# Patient Record
Sex: Male | Born: 1940 | Race: White | Hispanic: No | Marital: Married | Smoking: Former smoker
Health system: Southern US, Community
[De-identification: ages and names within clinical notes are randomized; demographics above are authoritative.]

## PROBLEM LIST (undated history)

## (undated) DIAGNOSIS — G2581 Restless legs syndrome: Secondary | ICD-10-CM

## (undated) DIAGNOSIS — K219 Gastro-esophageal reflux disease without esophagitis: Secondary | ICD-10-CM

## (undated) DIAGNOSIS — E785 Hyperlipidemia, unspecified: Secondary | ICD-10-CM

## (undated) DIAGNOSIS — R001 Bradycardia, unspecified: Secondary | ICD-10-CM

## (undated) HISTORY — PX: COLONOSCOPY: SHX174

## (undated) HISTORY — PX: APPENDECTOMY: SHX54

## (undated) HISTORY — DX: Hyperlipidemia, unspecified: E78.5

## (undated) HISTORY — DX: Bradycardia, unspecified: R00.1

## (undated) HISTORY — DX: Restless legs syndrome: G25.81

## (undated) HISTORY — DX: Gastro-esophageal reflux disease without esophagitis: K21.9

## (undated) HISTORY — PX: TONSILLECTOMY: SUR1361

## (undated) HISTORY — PX: CARDIAC CATHETERIZATION: SHX172

---

## 2000-10-18 ENCOUNTER — Ambulatory Visit (HOSPITAL_COMMUNITY): Admission: RE | Admit: 2000-10-18 | Discharge: 2000-10-18 | Payer: Self-pay | Admitting: Family Medicine

## 2000-10-18 ENCOUNTER — Encounter: Payer: Self-pay | Admitting: Family Medicine

## 2000-11-17 ENCOUNTER — Encounter: Payer: Self-pay | Admitting: Neurosurgery

## 2000-11-17 ENCOUNTER — Ambulatory Visit (HOSPITAL_COMMUNITY): Admission: RE | Admit: 2000-11-17 | Discharge: 2000-11-17 | Payer: Self-pay | Admitting: Neurosurgery

## 2001-06-05 ENCOUNTER — Ambulatory Visit (HOSPITAL_COMMUNITY): Admission: RE | Admit: 2001-06-05 | Discharge: 2001-06-05 | Payer: Self-pay | Admitting: Family Medicine

## 2001-06-05 ENCOUNTER — Encounter: Payer: Self-pay | Admitting: Family Medicine

## 2001-07-15 ENCOUNTER — Encounter: Payer: Self-pay | Admitting: Emergency Medicine

## 2001-07-15 ENCOUNTER — Observation Stay (HOSPITAL_COMMUNITY): Admission: EM | Admit: 2001-07-15 | Discharge: 2001-07-16 | Payer: Self-pay | Admitting: Emergency Medicine

## 2001-07-31 ENCOUNTER — Ambulatory Visit (HOSPITAL_COMMUNITY): Admission: RE | Admit: 2001-07-31 | Discharge: 2001-07-31 | Payer: Self-pay | Admitting: Family Medicine

## 2001-07-31 ENCOUNTER — Encounter: Payer: Self-pay | Admitting: Family Medicine

## 2003-03-06 ENCOUNTER — Ambulatory Visit (HOSPITAL_COMMUNITY): Admission: RE | Admit: 2003-03-06 | Discharge: 2003-03-06 | Payer: Self-pay | Admitting: Internal Medicine

## 2005-02-04 ENCOUNTER — Ambulatory Visit (HOSPITAL_COMMUNITY): Admission: RE | Admit: 2005-02-04 | Discharge: 2005-02-04 | Payer: Self-pay | Admitting: Family Medicine

## 2005-02-09 ENCOUNTER — Ambulatory Visit (HOSPITAL_COMMUNITY): Admission: RE | Admit: 2005-02-09 | Discharge: 2005-02-09 | Payer: Self-pay | Admitting: Family Medicine

## 2008-12-30 ENCOUNTER — Ambulatory Visit (HOSPITAL_COMMUNITY): Admission: RE | Admit: 2008-12-30 | Discharge: 2008-12-30 | Payer: Self-pay | Admitting: Internal Medicine

## 2009-01-03 ENCOUNTER — Ambulatory Visit (HOSPITAL_COMMUNITY): Admission: RE | Admit: 2009-01-03 | Discharge: 2009-01-03 | Payer: Self-pay | Admitting: Internal Medicine

## 2010-04-08 ENCOUNTER — Emergency Department (HOSPITAL_COMMUNITY): Payer: PRIVATE HEALTH INSURANCE

## 2010-04-08 ENCOUNTER — Emergency Department (HOSPITAL_COMMUNITY)
Admission: EM | Admit: 2010-04-08 | Discharge: 2010-04-08 | Disposition: A | Discharge status: Home / Self Care | Payer: PRIVATE HEALTH INSURANCE | Attending: Emergency Medicine | Admitting: Emergency Medicine

## 2010-04-08 DIAGNOSIS — K5289 Other specified noninfective gastroenteritis and colitis: Secondary | ICD-10-CM | POA: Insufficient documentation

## 2010-04-08 DIAGNOSIS — R112 Nausea with vomiting, unspecified: Secondary | ICD-10-CM | POA: Insufficient documentation

## 2010-04-08 DIAGNOSIS — K219 Gastro-esophageal reflux disease without esophagitis: Secondary | ICD-10-CM | POA: Insufficient documentation

## 2010-04-08 DIAGNOSIS — Z79899 Other long term (current) drug therapy: Secondary | ICD-10-CM | POA: Insufficient documentation

## 2010-04-08 DIAGNOSIS — E7800 Pure hypercholesterolemia, unspecified: Secondary | ICD-10-CM | POA: Insufficient documentation

## 2010-04-08 LAB — URINALYSIS, ROUTINE W REFLEX MICROSCOPIC
Bilirubin Urine: NEGATIVE
Glucose, UA: NEGATIVE mg/dL
Ketones, ur: NEGATIVE mg/dL
Protein, ur: NEGATIVE mg/dL
Specific Gravity, Urine: 1.015 (ref 1.005–1.030)

## 2010-04-08 LAB — DIFFERENTIAL
Eosinophils Absolute: 0.1 10*3/uL (ref 0.0–0.7)
Eosinophils Relative: 1 % (ref 0–5)
Lymphocytes Relative: 26 % (ref 12–46)
Lymphs Abs: 1.7 10*3/uL (ref 0.7–4.0)
Monocytes Absolute: 0.6 10*3/uL (ref 0.1–1.0)

## 2010-04-08 LAB — COMPREHENSIVE METABOLIC PANEL
ALT: 26 U/L (ref 0–53)
Alkaline Phosphatase: 61 U/L (ref 39–117)
CO2: 26 mEq/L (ref 19–32)
Chloride: 103 mEq/L (ref 96–112)
Creatinine, Ser: 1.22 mg/dL (ref 0.4–1.5)
GFR calc Af Amer: >60 mL/min (ref >60)
GFR calc non Af Amer: 59 mL/min — ABNORMAL LOW (ref >60)
Potassium: 3.6 mEq/L (ref 3.5–5.1)
Sodium: 139 mEq/L (ref 135–145)
Total Bilirubin: 2 mg/dL — ABNORMAL HIGH (ref 0.3–1.2)
Total Protein: 7.4 g/dL (ref 6.0–8.3)

## 2010-04-08 LAB — LIPASE, BLOOD: Lipase: 25 U/L (ref 11–59)

## 2010-04-08 LAB — CBC
Hemoglobin: 15.8 g/dL (ref 13.0–17.0)
Platelets: 180 10*3/uL (ref 150–400)
RBC: 5.3 MIL/uL (ref 4.22–5.81)
RDW: 13.9 % (ref 11.5–15.5)

## 2010-06-12 NOTE — Discharge Summary (Signed)
Andrew Mcgrath  Patient:    Andrew Mcgrath, Andrew Mcgrath Visit Number: [id number] MRN: [id number]          Service Type: MED Location: [facility] [phone] Attending Physician:  Darlin Priestly Dictated by:   Colette Ribas, M.D. Admit Date:  07/15/2001 Discharge Date: 07/16/2001                             Discharge Summary  DISCHARGE DIAGNOSES:  Chest pain, rule out myocardial infarction.  HISTORY OF PRESENT ILLNESS AND PAST MEDICAL HISTORY:  Please see admission H&P.  HOSPITAL COURSE:  A 70 year old gentleman with mild hyperlipidemia who has presented with right-sided chest pain.  He is admitted and ruled out.  CPK-MB and troponins were negative x3.  No ECG changes.  No telemetry changes while on monitoring.  He had no further episodes whatsoever of chest pain or any other associated symptomatology.  He felt quite well on the day of discharge. I talked with Dr. Domingo Sep about the patients case and she agreed with discharge and work-up as an outpatient.  The patient and his wife understand that if he has recurrent chest pain he is to take nitroglycerin and come back in immediately.  We will set up for outpatient follow-up with Dr. Domingo Sep tomorrow in her office.  We will set up further risk stratification at that time.  If any problems arise in the meantime he is to call or come back immediately.  DISCHARGE PHYSICAL EXAMINATION  VITAL SIGNS:  Afebrile.  Vital signs stable.  GENERAL:  Pleasant, talkative gentleman in no acute distress.  CHEST:  Clear to auscultation bilaterally.  CARDIOVASCULAR:  Rate and rhythm normal.  S1, S2.  No murmurs, gallops, rubs.  DISCHARGE LABORATORIES:  As stated above.  DISCHARGE CONDITION:  Stable.  DISCHARGE MEDICATIONS:  Same as admit, nitroglycerin p.r.n. Dictated by:   Colette Ribas, M.D. Attending Physician:  Darlin Priestly DD:  07/16/01 TD:  07/18/01 Job: [id number]

## 2010-06-12 NOTE — H&P (Signed)
[facility]  Patient:    Andrew Mcgrath, Andrew Mcgrath Visit Number: [id number] MRN: [id number]          Service Type: MED Location: [facility] [id number] Attending Physician:  Darlin Priestly Dictated by:   Dorthey Sawyer, M.D. Admit Date:  07/15/2001 Discharge Date: 07/16/2001                           History and Physical  ADMISSION DIAGNOSIS:  Chest pain, rule out myocardial infarction.  HISTORY OF PRESENT ILLNESS:  A 70 year old gentleman with hyperlipidemia who, the night before admission, awoke with right-sided chest pain which radiated to his right arm and right jaw.  It seemed to last 30 minutes to 45 minutes and then subsided.   He did not have any chest pain prior to this.   There was some associated diaphoresis no nausea.  It seemed to subside after about 30 to 45 minutes as stated.  When I saw him on Saturday morning in the office, he was doing fine with no complaints.  The ECG drawn in the office showed some slight ST changes which were different from a prior ECG, and that was in leads II, III, and some of the precordial leads.  There was also T wave inversion in V3 which was not in the prior ECG.  The patient was sent to the emergency department for further evaluation which was an overall negative workup.  The first set of enzymes were negative. Chest x-ray was negative per the emergency department.  The ER physician discussed this with Dr. Domingo Sep of [facility], and she felt the patient should be ruled out overnight, heparinized, and use nitroglycerin until further risk stratification could be performed.  PAST MEDICAL HISTORY:  Hypercholesterolemia.  MEDICATIONS ON ADMISSION:  Lipitor 10 mg 1/2 tablet q.h.s.  PHYSICAL EXAMINATION:  VITAL SIGNS:  Temperature 98.2, pulse 72, respirations 18, blood pressure 120/75.  GENERAL:  When I saw the patient, she was pleasant and in no acute distress.  HEENT:  Normocephalic, atraumatic.  Pupils  are equal, round and reactive to light.  Extraocular muscles intact.   Nasopharynx clear.  NECK:  No JVD.  CHEST:  Clear to auscultation bilaterally.  CARDIOVASCULAR:  Regular rate and rhythm, normal S1 and S2.  No murmurs, gallops, or rubs.  ABDOMEN:  Bowel sounds positive.  Soft, nontender, nondistended.  No obvious organomegaly, no masses.  EXTREMITIES:  No cyanosis, clubbing, or edema.  LABORATORY DATA:  D-dimer less than 0.50.  CBC normal.  PTT 24, PT 12.6. CHEM-12 normal.  Total bilirubin 1.7.  ASSESSMENT:  A 70 year old gentleman with hyperlipidemia who presents with right-sided chest pain.  PLAN: 1. Admit for rule out protocol. 2. Nitroglycerin drip. 3. Heparin drip. 4. Serial enzymes q.8h. x 3. 5. Will continue to closely.  If the patient rules out, will perform risk    stratification. Dictated by:   Dorthey Sawyer, M.D. Attending Physician:  Darlin Priestly DD:  07/16/01 TD:  07/17/01 Job: [id number]

## 2010-06-12 NOTE — Op Note (Signed)
NAME:  Andrew Mcgrath, Andrew Mcgrath                       ACCOUNT NO.:  [id number]   MEDICAL RECORD NO.:  [id number]                   PATIENT TYPE:  AMB   LOCATION:  DAY                                  FACILITY:  [facility]   PHYSICIAN:  Lionel December, M.D.                 DATE OF BIRTH:  1940-10-03   DATE OF PROCEDURE:  03/06/2003  DATE OF DISCHARGE:                                 OPERATIVE REPORT   PROCEDURE:  Total colonoscopy.   ENDOSCOPIST:  Lionel December, M.D.   INDICATIONS:  Jigar is a 70 year old Caucasian male who is undergoing  screening colonoscopy.  The procedure and risks were reviewed with the  patient and informed consent was obtained.   PREOPERATIVE MEDICATIONS:  Demerol 25 mg IV and Versed 4 mg IV.   FINDINGS:  Procedure performed in endoscopy suite.  The patient's vital  signs and O2 saturation were monitored during the procedure and remained  stable.  The patient was placed in the left lateral recumbent position and  rectal examination was performed.  No abnormality noted on external or  digital exam.   Olympus videoscope was placed in the rectum and advanced under vision into  the sigmoid colon and beyond.  The scope was passed into the cecum which was  identified by ileocecal valve and appendiceal stump.  Pictures were taken  for the record.  As the scope was withdrawn the colonic mucosa was carefully  examined.  There was a 4-5 mm polyp at hepatic flexure which was snared.  A  piece of this polyp was retrieved for histologic examination.  Most of it  was coagulated during this process.  The mucosa and the rest of the colon  was normal.  The rectal mucosa similarly was normal.   The scope was retroflexed to examine the anorectal junction and hemorrhoids  were noted below the dentate line.  The endoscope was straightened and  withdrawn.  The patient tolerated the procedure well.   FINAL DIAGNOSES:  1. Small polyp at hepatic flexure which was snared.  2. Small external  hemorrhoids.   RECOMMENDATIONS:  Standard instructions were given. I will be contacting the  patient with biopsy results and further recommendations.      ___________________________________________                                            Lionel December, M.D.   NR/MEDQ  D:  03/06/2003  T:  03/06/2003  Job:  [id number]   cc:   Patrica Duel, M.D.  [address]  [place]  [place] [address]  Fax: [phone]

## 2010-07-08 ENCOUNTER — Other Ambulatory Visit (HOSPITAL_COMMUNITY): Payer: Self-pay | Admitting: Internal Medicine

## 2010-07-08 DIAGNOSIS — R51 Headache: Secondary | ICD-10-CM

## 2010-07-10 ENCOUNTER — Ambulatory Visit (HOSPITAL_COMMUNITY)
Admission: RE | Admit: 2010-07-10 | Discharge: 2010-07-10 | Disposition: A | Discharge status: Home / Self Care | Payer: PRIVATE HEALTH INSURANCE | Source: Ambulatory Visit | Attending: Internal Medicine | Admitting: Internal Medicine

## 2010-07-10 DIAGNOSIS — R55 Syncope and collapse: Secondary | ICD-10-CM | POA: Insufficient documentation

## 2010-07-10 DIAGNOSIS — H538 Other visual disturbances: Secondary | ICD-10-CM | POA: Insufficient documentation

## 2010-07-10 DIAGNOSIS — R51 Headache: Secondary | ICD-10-CM

## 2010-07-13 ENCOUNTER — Other Ambulatory Visit (HOSPITAL_COMMUNITY): Payer: Self-pay | Admitting: Internal Medicine

## 2010-07-14 ENCOUNTER — Ambulatory Visit (HOSPITAL_COMMUNITY)
Admission: RE | Admit: 2010-07-14 | Discharge: 2010-07-14 | Disposition: A | Discharge status: Home / Self Care | Payer: PRIVATE HEALTH INSURANCE | Source: Ambulatory Visit | Attending: Internal Medicine | Admitting: Internal Medicine

## 2010-07-14 ENCOUNTER — Other Ambulatory Visit (HOSPITAL_COMMUNITY): Payer: Self-pay | Admitting: Internal Medicine

## 2010-07-14 DIAGNOSIS — R9389 Abnormal findings on diagnostic imaging of other specified body structures: Secondary | ICD-10-CM | POA: Insufficient documentation

## 2010-07-14 DIAGNOSIS — I6529 Occlusion and stenosis of unspecified carotid artery: Secondary | ICD-10-CM | POA: Insufficient documentation

## 2010-07-14 DIAGNOSIS — R51 Headache: Secondary | ICD-10-CM | POA: Insufficient documentation

## 2010-07-14 DIAGNOSIS — H538 Other visual disturbances: Secondary | ICD-10-CM | POA: Insufficient documentation

## 2010-07-15 ENCOUNTER — Ambulatory Visit (HOSPITAL_COMMUNITY)
Admission: RE | Admit: 2010-07-15 | Discharge: 2010-07-15 | Disposition: A | Discharge status: Home / Self Care | Payer: PRIVATE HEALTH INSURANCE | Source: Ambulatory Visit | Attending: Internal Medicine | Admitting: Internal Medicine

## 2010-07-15 ENCOUNTER — Other Ambulatory Visit (HOSPITAL_COMMUNITY): Payer: Self-pay | Admitting: Internal Medicine

## 2010-07-15 DIAGNOSIS — R51 Headache: Secondary | ICD-10-CM | POA: Insufficient documentation

## 2010-07-15 MED ORDER — GADOBENATE DIMEGLUMINE 529 MG/ML IV SOLN: 20.0000 mL | Freq: Once | INTRAVENOUS | Status: AC | PRN

## 2010-07-16 LAB — CREATININE, SERUM
Creatinine, Ser: 1.39 mg/dL — ABNORMAL HIGH (ref 0.50–1.35)
GFR calc non Af Amer: 51 mL/min — ABNORMAL LOW (ref >60)

## 2010-07-20 ENCOUNTER — Other Ambulatory Visit (HOSPITAL_COMMUNITY): Payer: Self-pay | Admitting: Internal Medicine

## 2010-07-20 DIAGNOSIS — I771 Stricture of artery: Secondary | ICD-10-CM

## 2010-07-23 ENCOUNTER — Other Ambulatory Visit (HOSPITAL_COMMUNITY): Payer: Self-pay | Admitting: Internal Medicine

## 2010-07-23 ENCOUNTER — Ambulatory Visit (HOSPITAL_COMMUNITY)
Admission: RE | Admit: 2010-07-23 | Discharge: 2010-07-23 | Disposition: A | Discharge status: Home / Self Care | Payer: PRIVATE HEALTH INSURANCE | Source: Ambulatory Visit | Attending: Internal Medicine | Admitting: Internal Medicine

## 2010-07-23 DIAGNOSIS — Z01812 Encounter for preprocedural laboratory examination: Secondary | ICD-10-CM | POA: Insufficient documentation

## 2010-07-23 DIAGNOSIS — R42 Dizziness and giddiness: Secondary | ICD-10-CM | POA: Insufficient documentation

## 2010-07-23 DIAGNOSIS — I771 Stricture of artery: Secondary | ICD-10-CM

## 2010-07-23 DIAGNOSIS — I6529 Occlusion and stenosis of unspecified carotid artery: Secondary | ICD-10-CM

## 2010-07-23 DIAGNOSIS — R930 Abnormal findings on diagnostic imaging of skull and head, not elsewhere classified: Secondary | ICD-10-CM | POA: Insufficient documentation

## 2010-07-23 LAB — CBC
HCT: 43.9 % (ref 39.0–52.0)
Hemoglobin: 15.3 g/dL (ref 13.0–17.0)
Platelets: 159 10*3/uL (ref 150–400)
RBC: 5.14 MIL/uL (ref 4.22–5.81)

## 2010-07-23 LAB — PROTIME-INR
INR: 0.96 (ref 0.00–1.49)
Prothrombin Time: 13 seconds (ref 11.6–15.2)

## 2010-07-23 LAB — BASIC METABOLIC PANEL
CO2: 29 mEq/L (ref 19–32)
Creatinine, Ser: 1.07 mg/dL (ref 0.50–1.35)
GFR calc Af Amer: >60 mL/min (ref >60)
GFR calc non Af Amer: >60 mL/min (ref >60)
Potassium: 4 mEq/L (ref 3.5–5.1)
Sodium: 142 mEq/L (ref 135–145)

## 2010-07-23 LAB — APTT: aPTT: 29 seconds (ref 24–37)

## 2010-07-23 MED ORDER — IOHEXOL 300 MG/ML  SOLN
150.0000 mL | Freq: Once | INTRAMUSCULAR | Status: AC | PRN
  Administered 2010-07-23: 70 mL via INTRAVENOUS

## 2011-12-31 ENCOUNTER — Ambulatory Visit (HOSPITAL_COMMUNITY)
Admission: RE | Admit: 2011-12-31 | Discharge: 2011-12-31 | Disposition: A | Discharge status: Home / Self Care | Payer: PRIVATE HEALTH INSURANCE | Source: Ambulatory Visit | Attending: Internal Medicine | Admitting: Internal Medicine

## 2011-12-31 ENCOUNTER — Other Ambulatory Visit (HOSPITAL_COMMUNITY): Payer: Self-pay | Admitting: Internal Medicine

## 2011-12-31 DIAGNOSIS — R05 Cough: Secondary | ICD-10-CM

## 2011-12-31 DIAGNOSIS — R509 Fever, unspecified: Secondary | ICD-10-CM

## 2011-12-31 DIAGNOSIS — R059 Cough, unspecified: Secondary | ICD-10-CM | POA: Insufficient documentation

## 2012-12-14 ENCOUNTER — Ambulatory Visit (INDEPENDENT_AMBULATORY_CARE_PROVIDER_SITE_OTHER): Payer: PRIVATE HEALTH INSURANCE

## 2012-12-14 ENCOUNTER — Encounter: Payer: Self-pay | Admitting: Podiatry

## 2012-12-14 ENCOUNTER — Ambulatory Visit (INDEPENDENT_AMBULATORY_CARE_PROVIDER_SITE_OTHER): Payer: PRIVATE HEALTH INSURANCE | Admitting: Podiatry

## 2012-12-14 VITALS — BP 149/87 | HR 61 | Resp 16 | Ht 73.0 in | Wt 225.0 lb

## 2012-12-14 DIAGNOSIS — M722 Plantar fascial fibromatosis: Secondary | ICD-10-CM

## 2012-12-14 MED ORDER — DICLOFENAC SODIUM 75 MG PO TBEC: 75.0000 mg | DELAYED_RELEASE_TABLET | Freq: Two times a day (BID) | ORAL | Status: DC

## 2012-12-14 MED ORDER — TRIAMCINOLONE ACETONIDE 10 MG/ML IJ SUSP
5.0000 mg | Freq: Once | INTRAMUSCULAR | Status: AC
  Administered 2012-12-14: 5 mg via INTRA_ARTICULAR

## 2012-12-14 NOTE — Patient Instructions (Signed)
Plantar Fasciitis (Heel Spur Syndrome)  with Rehab  The plantar fascia is a fibrous, ligament-like, soft-tissue structure that spans the bottom of the foot. Plantar fasciitis is a condition that causes pain in the foot due to inflammation of the tissue.  SYMPTOMS   · Pain and tenderness on the underneath side of the foot.  · Pain that worsens with standing or walking.  CAUSES   Plantar fasciitis is caused by irritation and injury to the plantar fascia on the underneath side of the foot. Common mechanisms of injury include:  · Direct trauma to bottom of the foot.  · Damage to a small nerve that runs under the foot where the main fascia attaches to the heel bone.  · Stress placed on the plantar fascia due to bone spurs.  RISK INCREASES WITH:   · Activities that place stress on the plantar fascia (running, jumping, pivoting, or cutting).  · Poor strength and flexibility.  · Improperly fitted shoes.  · Tight calf muscles.  · Flat feet.  · Failure to warm-up properly before activity.  · Obesity.  PREVENTION  · Warm up and stretch properly before activity.  · Allow for adequate recovery between workouts.  · Maintain physical fitness:  · Strength, flexibility, and endurance.  · Cardiovascular fitness.  · Maintain a health body weight.  · Avoid stress on the plantar fascia.  · Wear properly fitted shoes, including arch supports for individuals who have flat feet.  PROGNOSIS   If treated properly, then the symptoms of plantar fasciitis usually resolve without surgery. However, occasionally surgery is necessary.  RELATED COMPLICATIONS   · Recurrent symptoms that may result in a chronic condition.  · Problems of the lower back that are caused by compensating for the injury, such as limping.  · Pain or weakness of the foot during push-off following surgery.  · Chronic inflammation, scarring, and partial or complete fascia tear, occurring more often from repeated injections.  TREATMENT   Treatment initially involves the use of  ice and medication to help reduce pain and inflammation. The use of strengthening and stretching exercises may help reduce pain with activity, especially stretches of the Achilles tendon. These exercises may be performed at home or with a therapist. Your caregiver may recommend that you use heel cups of arch supports to help reduce stress on the plantar fascia. Occasionally, corticosteroid injections are given to reduce inflammation. If symptoms persist for greater than 6 months despite non-surgical (conservative), then surgery may be recommended.   MEDICATION   · If pain medication is necessary, then nonsteroidal anti-inflammatory medications, such as aspirin and ibuprofen, or other minor pain relievers, such as acetaminophen, are often recommended.  · Do not take pain medication within 7 days before surgery.  · Prescription pain relievers may be given if deemed necessary by your caregiver. Use only as directed and only as much as you need.  · Corticosteroid injections may be given by your caregiver. These injections should be reserved for the most serious cases, because they may only be given a certain number of times.  HEAT AND COLD  · Cold treatment (icing) relieves pain and reduces inflammation. Cold treatment should be applied for 10 to 15 minutes every 2 to 3 hours for inflammation and pain and immediately after any activity that aggravates your symptoms. Use ice packs or massage the area with a piece of ice (ice massage).  · Heat treatment may be used prior to performing the stretching and strengthening activities prescribed   by your caregiver, physical therapist, or athletic trainer. Use a heat pack or soak the injury in warm water.  SEEK IMMEDIATE MEDICAL CARE IF:  · Treatment seems to offer no benefit, or the condition worsens.  · Any medications produce adverse side effects.  EXERCISES  RANGE OF MOTION (ROM) AND STRETCHING EXERCISES - Plantar Fasciitis (Heel Spur Syndrome)  These exercises may help you  when beginning to rehabilitate your injury. Your symptoms may resolve with or without further involvement from your physician, physical therapist or athletic trainer. While completing these exercises, remember:   · Restoring tissue flexibility helps normal motion to return to the joints. This allows healthier, less painful movement and activity.  · An effective stretch should be held for at least 30 seconds.  · A stretch should never be painful. You should only feel a gentle lengthening or release in the stretched tissue.  RANGE OF MOTION - Toe Extension, Flexion  · Sit with your right / left leg crossed over your opposite knee.  · Grasp your toes and gently pull them back toward the top of your foot. You should feel a stretch on the bottom of your toes and/or foot.  · Hold this stretch for __________ seconds.  · Now, gently pull your toes toward the bottom of your foot. You should feel a stretch on the top of your toes and or foot.  · Hold this stretch for __________ seconds.  Repeat __________ times. Complete this stretch __________ times per day.   RANGE OF MOTION - Ankle Dorsiflexion, Active Assisted  · Remove shoes and sit on a chair that is preferably not on a carpeted surface.  · Place right / left foot under knee. Extend your opposite leg for support.  · Keeping your heel down, slide your right / left foot back toward the chair until you feel a stretch at your ankle or calf. If you do not feel a stretch, slide your bottom forward to the edge of the chair, while still keeping your heel down.  · Hold this stretch for __________ seconds.  Repeat __________ times. Complete this stretch __________ times per day.   STRETCH  Gastroc, Standing  · Place hands on wall.  · Extend right / left leg, keeping the front knee somewhat bent.  · Slightly point your toes inward on your back foot.  · Keeping your right / left heel on the floor and your knee straight, shift your weight toward the wall, not allowing your back to  arch.  · You should feel a gentle stretch in the right / left calf. Hold this position for __________ seconds.  Repeat __________ times. Complete this stretch __________ times per day.  STRETCH  Soleus, Standing  · Place hands on wall.  · Extend right / left leg, keeping the other knee somewhat bent.  · Slightly point your toes inward on your back foot.  · Keep your right / left heel on the floor, bend your back knee, and slightly shift your weight over the back leg so that you feel a gentle stretch deep in your back calf.  · Hold this position for __________ seconds.  Repeat __________ times. Complete this stretch __________ times per day.  STRETCH  Gastrocsoleus, Standing   Note: This exercise can place a lot of stress on your foot and ankle. Please complete this exercise only if specifically instructed by your caregiver.   · Place the ball of your right / left foot on a step, keeping   your other foot firmly on the same step.  · Hold on to the wall or a rail for balance.  · Slowly lift your other foot, allowing your body weight to press your heel down over the edge of the step.  · You should feel a stretch in your right / left calf.  · Hold this position for __________ seconds.  · Repeat this exercise with a slight bend in your right / left knee.  Repeat __________ times. Complete this stretch __________ times per day.   STRENGTHENING EXERCISES - Plantar Fasciitis (Heel Spur Syndrome)   These exercises may help you when beginning to rehabilitate your injury. They may resolve your symptoms with or without further involvement from your physician, physical therapist or athletic trainer. While completing these exercises, remember:   · Muscles can gain both the endurance and the strength needed for everyday activities through controlled exercises.  · Complete these exercises as instructed by your physician, physical therapist or athletic trainer. Progress the resistance and repetitions only as guided.  STRENGTH - Towel  Curls  · Sit in a chair positioned on a non-carpeted surface.  · Place your foot on a towel, keeping your heel on the floor.  · Pull the towel toward your heel by only curling your toes. Keep your heel on the floor.  · If instructed by your physician, physical therapist or athletic trainer, add ____________________ at the end of the towel.  Repeat __________ times. Complete this exercise __________ times per day.  STRENGTH - Ankle Inversion  · Secure one end of a rubber exercise band/tubing to a fixed object (table, pole). Loop the other end around your foot just before your toes.  · Place your fists between your knees. This will focus your strengthening at your ankle.  · Slowly, pull your big toe up and in, making sure the band/tubing is positioned to resist the entire motion.  · Hold this position for __________ seconds.  · Have your muscles resist the band/tubing as it slowly pulls your foot back to the starting position.  Repeat __________ times. Complete this exercises __________ times per day.   Document Released: 01/11/2005 Document Revised: 04/05/2011 Document Reviewed: 04/25/2008  [employer]® Patient Information ©2014 [employer].

## 2012-12-14 NOTE — Progress Notes (Signed)
  Subjective:    Patient ID: Andrew Mcgrath, male    DOB: 12-10-1940, 72 y.o.   MRN: [id number]  HPI Comments: "I've got pain in the middle of my heel"  N - aches L - plantar heel right D - 1 yr. O - gradual C - AM pain, worse A - walking T - Saw podiatrist in [place], gave series of 3 shots, Rx'd Skelaxin, no help, did see PCP and changed meds to Diclofenac, wearing OTC inserts   Pt states a lady at his church is a patient of Dr. Charlsie Merles and recommended our facility.      Review of Systems  Eyes: Positive for redness and itching.  Musculoskeletal: Positive for gait problem and myalgias.  All other systems reviewed and are negative.       Objective:   Physical Exam        Assessment & Plan:

## 2012-12-15 NOTE — Progress Notes (Signed)
Subjective:     Patient ID: Andrew Mcgrath, male   DOB: 02-09-1940, 72 y.o.   MRN: [id number]  HPI patient presents stating I have had heel pain right for 1 year and is seeing another physician who did 3 injections which gave temporary relief but no long-term relief. I've tried over-the-counter insoles without relief of symptoms and any time I try to do activity I have intense pain afterward  Review of Systems  All other systems reviewed and are negative.       Objective:   Physical Exam  Nursing note and vitals reviewed. Constitutional: He is oriented to person, place, and time.  Cardiovascular: Intact distal pulses.   Musculoskeletal: Normal range of motion.  Neurological: He is oriented to person, place, and time.  Skin: Skin is warm.   neurovascular status intact with mild equinus condition noted both feet patient has discomfort at the insertion of the tendon into the calcaneus with inflammation and fluid around the attachment.     Assessment:     Plantar fasciitis right that has not responded conservatively at this time    Plan:     I reviewed a great length plantar fasciitis and x-rays today. I have recommended a relatively aggressive approach and if no improvement we will need to consider surgery or E. Andrew treatment. At this time I injected the plantar fascia 3 mg Kenalog 5 mg Xylocaine Marcaine mixture and dispensed night splint with all instructions on usage along with aggressive ice therapy. Reappoint in 3 weeks

## 2013-01-04 ENCOUNTER — Ambulatory Visit (INDEPENDENT_AMBULATORY_CARE_PROVIDER_SITE_OTHER): Payer: PRIVATE HEALTH INSURANCE | Admitting: Podiatry

## 2013-01-04 ENCOUNTER — Encounter: Payer: Self-pay | Admitting: Podiatry

## 2013-01-04 VITALS — BP 145/72 | HR 62 | Resp 17 | Ht 73.0 in | Wt 220.0 lb

## 2013-01-04 DIAGNOSIS — M722 Plantar fascial fibromatosis: Secondary | ICD-10-CM

## 2013-01-04 NOTE — Progress Notes (Signed)
Follow-up for right heel pain.  Pt states has improved.

## 2013-01-08 NOTE — Progress Notes (Signed)
Subjective:     Patient ID: Andrew Mcgrath, male   DOB: 04/09/1940, 72 y.o.   MRN: [id number]  HPI patient states I'm doing much better with pain of a mild nature   Review of Systems     Objective:   Physical Exam Neurovascular status intact with no health and minimal pain to palpation of the heel history issues noted    Assessment:     Improve plantar fasciitis heels    Plan:     Instructed on physical therapy supportive shoe gear and anti-inflammatories. Reappoint as needed

## 2013-03-22 ENCOUNTER — Ambulatory Visit: Payer: PRIVATE HEALTH INSURANCE | Admitting: Podiatry

## 2013-04-02 ENCOUNTER — Ambulatory Visit: Payer: PRIVATE HEALTH INSURANCE | Admitting: Podiatry

## 2013-04-11 ENCOUNTER — Ambulatory Visit: Payer: PRIVATE HEALTH INSURANCE | Admitting: Podiatry

## 2013-06-20 ENCOUNTER — Telehealth (INDEPENDENT_AMBULATORY_CARE_PROVIDER_SITE_OTHER): Payer: Self-pay | Admitting: *Deleted

## 2013-06-20 ENCOUNTER — Other Ambulatory Visit (INDEPENDENT_AMBULATORY_CARE_PROVIDER_SITE_OTHER): Payer: Self-pay | Admitting: *Deleted

## 2013-06-20 DIAGNOSIS — Z1211 Encounter for screening for malignant neoplasm of colon: Secondary | ICD-10-CM

## 2013-06-20 DIAGNOSIS — Z860100 Personal history of colon polyps, unspecified: Secondary | ICD-10-CM

## 2013-06-20 MED ORDER — PEG-KCL-NACL-NASULF-NA ASC-C 100 G PO SOLR: 1.0000 | Freq: Once | ORAL | Status: DC

## 2013-06-20 NOTE — Telephone Encounter (Signed)
Patient needs movi prep 

## 2013-08-02 ENCOUNTER — Telehealth (INDEPENDENT_AMBULATORY_CARE_PROVIDER_SITE_OTHER): Payer: Self-pay | Admitting: *Deleted

## 2013-08-02 NOTE — Telephone Encounter (Signed)
  Procedure: tcs  Reason/Indication:  Hx polyps  Has patient had this procedure before?  Yes, 2005 -- epic  If so, when, by whom and where?    Is there a family history of colon cancer?  no  Who?  What age when diagnosed?    Is patient diabetic?   no      Does patient have prosthetic heart valve?  no  Do you have a pacemaker?  no  Has patient ever had endocarditis? no  Has patient had joint replacement within last 12 months?  no  Does patient tend to be constipated or take laxatives? yes  Is patient on Coumadin, Plavix and/or Aspirin? no  Medications: atorvastatin 40 mg daily, lyrica 75 mg nightly, omeprazole 20 mg daily, citrucel daily  Allergies: nkda  Medication Adjustment:   Procedure date & time: 08/23/13 at 12

## 2013-08-06 NOTE — Telephone Encounter (Signed)
agree

## 2013-08-14 ENCOUNTER — Encounter (HOSPITAL_COMMUNITY): Payer: Self-pay | Admitting: Pharmacy Technician

## 2013-08-23 ENCOUNTER — Ambulatory Visit (HOSPITAL_COMMUNITY)
Admission: RE | Admit: 2013-08-23 | Discharge: 2013-08-23 | Disposition: A | Discharge status: Home / Self Care | Payer: PRIVATE HEALTH INSURANCE | Source: Ambulatory Visit | Attending: Internal Medicine | Admitting: Internal Medicine

## 2013-08-23 ENCOUNTER — Encounter (HOSPITAL_COMMUNITY)
Admission: RE | Disposition: A | Discharge status: Home / Self Care | Payer: Self-pay | Source: Ambulatory Visit | Attending: Internal Medicine

## 2013-08-23 ENCOUNTER — Encounter (HOSPITAL_COMMUNITY): Payer: Self-pay | Admitting: *Deleted

## 2013-08-23 DIAGNOSIS — Z7982 Long term (current) use of aspirin: Secondary | ICD-10-CM | POA: Insufficient documentation

## 2013-08-23 DIAGNOSIS — Z860100 Personal history of colon polyps, unspecified: Secondary | ICD-10-CM

## 2013-08-23 DIAGNOSIS — Z79899 Other long term (current) drug therapy: Secondary | ICD-10-CM | POA: Insufficient documentation

## 2013-08-23 DIAGNOSIS — D126 Benign neoplasm of colon, unspecified: Secondary | ICD-10-CM | POA: Insufficient documentation

## 2013-08-23 DIAGNOSIS — K644 Residual hemorrhoidal skin tags: Secondary | ICD-10-CM

## 2013-08-23 DIAGNOSIS — Z87891 Personal history of nicotine dependence: Secondary | ICD-10-CM | POA: Insufficient documentation

## 2013-08-23 DIAGNOSIS — K219 Gastro-esophageal reflux disease without esophagitis: Secondary | ICD-10-CM | POA: Insufficient documentation

## 2013-08-23 DIAGNOSIS — E785 Hyperlipidemia, unspecified: Secondary | ICD-10-CM | POA: Insufficient documentation

## 2013-08-23 HISTORY — PX: COLONOSCOPY: SHX5424

## 2013-08-23 SURGERY — COLONOSCOPY
Anesthesia: Moderate Sedation

## 2013-08-23 MED ORDER — STERILE WATER FOR IRRIGATION IR SOLN
Status: DC | PRN
  Administered 2013-08-23: 11:00:00

## 2013-08-23 MED ORDER — SODIUM CHLORIDE 0.9 % IV SOLN
INTRAVENOUS | Status: DC
  Administered 2013-08-23: 11:00:00 via INTRAVENOUS

## 2013-08-23 MED ORDER — MIDAZOLAM HCL 5 MG/5ML IJ SOLN
INTRAMUSCULAR | Status: AC
  Filled 2013-08-23: qty 10

## 2013-08-23 MED ORDER — MEPERIDINE HCL 50 MG/ML IJ SOLN
INTRAMUSCULAR | Status: DC | PRN
  Administered 2013-08-23 (×2): 25 mg via INTRAVENOUS

## 2013-08-23 MED ORDER — MEPERIDINE HCL 50 MG/ML IJ SOLN
INTRAMUSCULAR | Status: AC
  Filled 2013-08-23: qty 1

## 2013-08-23 MED ORDER — MIDAZOLAM HCL 5 MG/5ML IJ SOLN
INTRAMUSCULAR | Status: DC | PRN
  Administered 2013-08-23: 2 mg via INTRAVENOUS
  Administered 2013-08-23 (×3): 1 mg via INTRAVENOUS
  Administered 2013-08-23: 2 mg via INTRAVENOUS

## 2013-08-23 NOTE — Discharge Instructions (Signed)
No aspirin or NSAIDs for 1 week ?Resume other medications and diet as before ?No driving for 24 hours ?Physician will call with biopsy results. ?

## 2013-08-23 NOTE — Op Note (Signed)
COLONOSCOPY PROCEDURE REPORT  PATIENT:  Andrew Mcgrath  MR#:  [id number] Birthdate:  [birth year], 73 y.o., male Endoscopist:  Dr. Rogene Houston, MD Referred By:  Dr. Bertell Maria call, MD  Procedure Date: 08/23/2013  Procedure:   Colonoscopy  Indications: Patient is a 73 year old Caucasian male with history of colonic adenoma and is here for surveillance colonoscopy.  Informed Consent:  The procedure and risks were reviewed with the patient and informed consent was obtained.  Medications:  Demerol 50 mg IV Versed 7 mg IV  Description of procedure:  After a digital rectal exam was performed, that colonoscope was advanced from the anus through the rectum and colon to the area of the cecum, ileocecal valve and appendiceal orifice. The cecum was deeply intubated. These structures were well-seen and photographed for the record. From the level of the cecum and ileocecal valve, the scope was slowly and cautiously withdrawn. The mucosal surfaces were carefully surveyed utilizing scope tip to flexion to facilitate fold flattening as needed. The scope was pulled down into the rectum where a thorough exam including retroflexion was performed.  Findings:  Prep satisfactory. 8 mm polyp snared in two pieces from ascending colon. This polyp was located just above the ileocecal valve behind a fold. Polypectomy complete. 3 mm polyp of there are "biopsy from proximal sigmoid colon. Normal rectal mucosa. Small hemorrhoids below the dentate line.   Therapeutic/Diagnostic Maneuvers Performed:  See above  Complications:  None  Cecal Withdrawal Time:  20 minutes  Impression:  Examination performed to cecum. 63mm polyp hot snared from ascending colon. 3 mm polyp of there are core biopsy from proximal sigmoid colon. Small external hemorrhoids.  Recommendations:  Standard instructions given. I will contact patient with biopsy results and further recommendations.  Surina Storts U  08/23/2013 12:09 PM  CC:  Dr. Delphina Cahill, MD & Dr. Rayne Du ref. provider found

## 2013-08-23 NOTE — H&P (Signed)
Andrew Mcgrath is an 73 y.o. male.   Chief Complaint: Patient is here for colonoscopy. HPI: Patient is 73 year old Caucasian male with history of colonic adenoma. He is in for surveillance colonoscopy. He denies abdominal pain change in bowel habits or rectal bleeding. He had adenoma removed from his ascending colon 10 years ago. No polyp was found on his last exam 5 years ago at [facility] in [place]. Family history is negative for CRC.   Past Medical History  Diagnosis Date  . Hyperlipidemia   . GERD (gastroesophageal reflux disease)   . Familial restless leg syndrome     Past Surgical History  Procedure Laterality Date  . Appendectomy    . Tonsillectomy    . Colonoscopy    . Cardiac catheterization      Family History  Problem Relation Age of Onset  . Colon cancer Neg Hx    Social History:  reports that he has quit smoking. His smoking use included Cigarettes. He has a 30 pack-year smoking history. He does not have any smokeless tobacco history on file. He reports that he drinks alcohol. He reports that he does not use illicit drugs.  Allergies: No Known Allergies  Medications Prior to Admission  Medication Sig Dispense Refill  . aspirin 81 MG tablet Take 81 mg by mouth daily.      Marland Kitchen atorvastatin (LIPITOR) 40 MG tablet Take 40 mg by mouth daily.      Marland Kitchen omeprazole (PRILOSEC) 20 MG capsule Take 20 mg by mouth daily.      . peg 3350 powder (MOVIPREP) 100 G SOLR Take 1 kit (200 g total) by mouth once.  1 kit  0  . pregabalin (LYRICA) 75 MG capsule Take 75 mg by mouth at bedtime.        No results found for this or any previous visit (from the past 48 hour(s)). No results found.  ROS  Blood pressure 137/84, pulse 68, temperature 98.1 F (36.7 C), temperature source Oral, resp. rate 16, height 6' 1"  (1.854 m), weight 220 lb (99.791 kg), SpO2 94.00%. Physical Exam  Constitutional: He appears well-developed and well-nourished.  HENT:  Mouth/Throat: Oropharynx is clear  and moist.  Eyes: Conjunctivae are normal. No scleral icterus.  Neck: No thyromegaly present.  Cardiovascular: Normal rate, regular rhythm and normal heart sounds.   No murmur heard. Respiratory: Effort normal and breath sounds normal.  GI: Soft. He exhibits no distension and no mass. There is no tenderness.  Small umbilical hernia  Musculoskeletal: He exhibits no edema.  Lymphadenopathy:    He has no cervical adenopathy.  Neurological: He is alert.  Skin: Skin is warm and dry.     Assessment/Plan History of colonic adenoma. Surveillance colonoscopy.  REHMAN,NAJEEB U 08/23/2013, 11:20 AM

## 2013-08-24 ENCOUNTER — Encounter (HOSPITAL_COMMUNITY): Payer: Self-pay | Admitting: Internal Medicine

## 2013-08-28 ENCOUNTER — Encounter (INDEPENDENT_AMBULATORY_CARE_PROVIDER_SITE_OTHER): Payer: Self-pay | Admitting: *Deleted

## 2013-10-05 ENCOUNTER — Other Ambulatory Visit: Payer: Self-pay | Admitting: Dermatology

## 2013-11-29 ENCOUNTER — Other Ambulatory Visit: Payer: Self-pay | Admitting: Dermatology

## 2013-12-18 ENCOUNTER — Other Ambulatory Visit: Payer: Self-pay | Admitting: Dermatology

## 2014-01-10 ENCOUNTER — Ambulatory Visit (INDEPENDENT_AMBULATORY_CARE_PROVIDER_SITE_OTHER): Payer: PRIVATE HEALTH INSURANCE | Admitting: Otolaryngology

## 2014-01-10 DIAGNOSIS — R040 Epistaxis: Secondary | ICD-10-CM

## 2014-02-07 ENCOUNTER — Ambulatory Visit (INDEPENDENT_AMBULATORY_CARE_PROVIDER_SITE_OTHER): Payer: PRIVATE HEALTH INSURANCE | Admitting: Otolaryngology

## 2014-02-07 DIAGNOSIS — R040 Epistaxis: Secondary | ICD-10-CM

## 2015-03-25 DIAGNOSIS — E782 Mixed hyperlipidemia: Secondary | ICD-10-CM | POA: Diagnosis not present

## 2015-04-02 DIAGNOSIS — H10413 Chronic giant papillary conjunctivitis, bilateral: Secondary | ICD-10-CM | POA: Diagnosis not present

## 2015-04-02 DIAGNOSIS — H04123 Dry eye syndrome of bilateral lacrimal glands: Secondary | ICD-10-CM | POA: Diagnosis not present

## 2015-04-02 DIAGNOSIS — H2513 Age-related nuclear cataract, bilateral: Secondary | ICD-10-CM | POA: Diagnosis not present

## 2015-07-02 DIAGNOSIS — E875 Hyperkalemia: Secondary | ICD-10-CM | POA: Diagnosis not present

## 2015-07-18 DIAGNOSIS — H01022 Squamous blepharitis right lower eyelid: Secondary | ICD-10-CM | POA: Diagnosis not present

## 2015-07-18 DIAGNOSIS — H2513 Age-related nuclear cataract, bilateral: Secondary | ICD-10-CM | POA: Diagnosis not present

## 2015-07-18 DIAGNOSIS — H01024 Squamous blepharitis left upper eyelid: Secondary | ICD-10-CM | POA: Diagnosis not present

## 2015-07-18 DIAGNOSIS — H01025 Squamous blepharitis left lower eyelid: Secondary | ICD-10-CM | POA: Diagnosis not present

## 2015-07-18 DIAGNOSIS — H01021 Squamous blepharitis right upper eyelid: Secondary | ICD-10-CM | POA: Diagnosis not present

## 2015-07-18 DIAGNOSIS — H10413 Chronic giant papillary conjunctivitis, bilateral: Secondary | ICD-10-CM | POA: Diagnosis not present

## 2015-07-18 DIAGNOSIS — H04123 Dry eye syndrome of bilateral lacrimal glands: Secondary | ICD-10-CM | POA: Diagnosis not present

## 2015-07-30 DIAGNOSIS — E782 Mixed hyperlipidemia: Secondary | ICD-10-CM | POA: Diagnosis not present

## 2015-07-30 DIAGNOSIS — E291 Testicular hypofunction: Secondary | ICD-10-CM | POA: Diagnosis not present

## 2015-12-16 DIAGNOSIS — D225 Melanocytic nevi of trunk: Secondary | ICD-10-CM | POA: Diagnosis not present

## 2015-12-16 DIAGNOSIS — Z85828 Personal history of other malignant neoplasm of skin: Secondary | ICD-10-CM | POA: Diagnosis not present

## 2015-12-16 DIAGNOSIS — L570 Actinic keratosis: Secondary | ICD-10-CM | POA: Diagnosis not present

## 2015-12-16 DIAGNOSIS — Z23 Encounter for immunization: Secondary | ICD-10-CM | POA: Diagnosis not present

## 2015-12-16 DIAGNOSIS — R233 Spontaneous ecchymoses: Secondary | ICD-10-CM | POA: Diagnosis not present

## 2015-12-16 DIAGNOSIS — Z808 Family history of malignant neoplasm of other organs or systems: Secondary | ICD-10-CM | POA: Diagnosis not present

## 2015-12-16 DIAGNOSIS — Z86018 Personal history of other benign neoplasm: Secondary | ICD-10-CM | POA: Diagnosis not present

## 2016-05-12 DIAGNOSIS — L089 Local infection of the skin and subcutaneous tissue, unspecified: Secondary | ICD-10-CM | POA: Diagnosis not present

## 2016-05-12 DIAGNOSIS — L570 Actinic keratosis: Secondary | ICD-10-CM | POA: Diagnosis not present

## 2016-05-25 DIAGNOSIS — H04123 Dry eye syndrome of bilateral lacrimal glands: Secondary | ICD-10-CM | POA: Diagnosis not present

## 2016-05-25 DIAGNOSIS — H10413 Chronic giant papillary conjunctivitis, bilateral: Secondary | ICD-10-CM | POA: Diagnosis not present

## 2016-05-25 DIAGNOSIS — H01021 Squamous blepharitis right upper eyelid: Secondary | ICD-10-CM | POA: Diagnosis not present

## 2016-05-25 DIAGNOSIS — H11823 Conjunctivochalasis, bilateral: Secondary | ICD-10-CM | POA: Diagnosis not present

## 2016-05-25 DIAGNOSIS — H2513 Age-related nuclear cataract, bilateral: Secondary | ICD-10-CM | POA: Diagnosis not present

## 2016-05-25 DIAGNOSIS — H01025 Squamous blepharitis left lower eyelid: Secondary | ICD-10-CM | POA: Diagnosis not present

## 2016-05-25 DIAGNOSIS — H01022 Squamous blepharitis right lower eyelid: Secondary | ICD-10-CM | POA: Diagnosis not present

## 2016-05-25 DIAGNOSIS — H01024 Squamous blepharitis left upper eyelid: Secondary | ICD-10-CM | POA: Diagnosis not present

## 2016-10-25 ENCOUNTER — Ambulatory Visit (INDEPENDENT_AMBULATORY_CARE_PROVIDER_SITE_OTHER): Payer: PRIVATE HEALTH INSURANCE | Admitting: Otolaryngology

## 2016-10-25 DIAGNOSIS — H9313 Tinnitus, bilateral: Secondary | ICD-10-CM | POA: Diagnosis not present

## 2016-10-25 DIAGNOSIS — H903 Sensorineural hearing loss, bilateral: Secondary | ICD-10-CM

## 2016-10-25 DIAGNOSIS — H6983 Other specified disorders of Eustachian tube, bilateral: Secondary | ICD-10-CM | POA: Diagnosis not present

## 2016-11-18 DIAGNOSIS — Z23 Encounter for immunization: Secondary | ICD-10-CM | POA: Diagnosis not present

## 2016-12-06 ENCOUNTER — Ambulatory Visit (INDEPENDENT_AMBULATORY_CARE_PROVIDER_SITE_OTHER): Payer: PRIVATE HEALTH INSURANCE | Admitting: Otolaryngology

## 2017-02-15 DIAGNOSIS — L905 Scar conditions and fibrosis of skin: Secondary | ICD-10-CM | POA: Diagnosis not present

## 2017-02-15 DIAGNOSIS — C44329 Squamous cell carcinoma of skin of other parts of face: Secondary | ICD-10-CM | POA: Diagnosis not present

## 2017-03-02 DIAGNOSIS — Z6830 Body mass index (BMI) 30.0-30.9, adult: Secondary | ICD-10-CM | POA: Diagnosis not present

## 2017-03-02 DIAGNOSIS — J111 Influenza due to unidentified influenza virus with other respiratory manifestations: Secondary | ICD-10-CM | POA: Diagnosis not present

## 2017-03-02 DIAGNOSIS — R05 Cough: Secondary | ICD-10-CM | POA: Diagnosis not present

## 2017-03-02 DIAGNOSIS — J09X2 Influenza due to identified novel influenza A virus with other respiratory manifestations: Secondary | ICD-10-CM | POA: Diagnosis not present

## 2017-04-07 DIAGNOSIS — R253 Fasciculation: Secondary | ICD-10-CM | POA: Diagnosis not present

## 2017-04-07 DIAGNOSIS — I10 Essential (primary) hypertension: Secondary | ICD-10-CM | POA: Diagnosis not present

## 2017-04-07 DIAGNOSIS — R05 Cough: Secondary | ICD-10-CM | POA: Diagnosis not present

## 2017-04-07 DIAGNOSIS — Z6829 Body mass index (BMI) 29.0-29.9, adult: Secondary | ICD-10-CM | POA: Diagnosis not present

## 2017-04-07 DIAGNOSIS — Z23 Encounter for immunization: Secondary | ICD-10-CM | POA: Diagnosis not present

## 2017-04-07 DIAGNOSIS — R040 Epistaxis: Secondary | ICD-10-CM | POA: Diagnosis not present

## 2017-04-07 DIAGNOSIS — K219 Gastro-esophageal reflux disease without esophagitis: Secondary | ICD-10-CM | POA: Diagnosis not present

## 2017-04-07 DIAGNOSIS — F5221 Male erectile disorder: Secondary | ICD-10-CM | POA: Diagnosis not present

## 2017-04-07 DIAGNOSIS — R8290 Unspecified abnormal findings in urine: Secondary | ICD-10-CM | POA: Diagnosis not present

## 2017-04-07 DIAGNOSIS — E782 Mixed hyperlipidemia: Secondary | ICD-10-CM | POA: Diagnosis not present

## 2017-04-07 DIAGNOSIS — J09X2 Influenza due to identified novel influenza A virus with other respiratory manifestations: Secondary | ICD-10-CM | POA: Diagnosis not present

## 2017-04-07 DIAGNOSIS — E291 Testicular hypofunction: Secondary | ICD-10-CM | POA: Diagnosis not present

## 2017-04-07 DIAGNOSIS — Z6830 Body mass index (BMI) 30.0-30.9, adult: Secondary | ICD-10-CM | POA: Diagnosis not present

## 2017-04-11 DIAGNOSIS — E291 Testicular hypofunction: Secondary | ICD-10-CM | POA: Diagnosis not present

## 2017-04-11 DIAGNOSIS — K219 Gastro-esophageal reflux disease without esophagitis: Secondary | ICD-10-CM | POA: Diagnosis not present

## 2017-04-11 DIAGNOSIS — I10 Essential (primary) hypertension: Secondary | ICD-10-CM | POA: Diagnosis not present

## 2017-04-11 DIAGNOSIS — Z23 Encounter for immunization: Secondary | ICD-10-CM | POA: Diagnosis not present

## 2017-04-11 DIAGNOSIS — F5221 Male erectile disorder: Secondary | ICD-10-CM | POA: Diagnosis not present

## 2017-04-11 DIAGNOSIS — J09X2 Influenza due to identified novel influenza A virus with other respiratory manifestations: Secondary | ICD-10-CM | POA: Diagnosis not present

## 2017-04-11 DIAGNOSIS — E782 Mixed hyperlipidemia: Secondary | ICD-10-CM | POA: Diagnosis not present

## 2017-04-11 DIAGNOSIS — R253 Fasciculation: Secondary | ICD-10-CM | POA: Diagnosis not present

## 2017-04-11 DIAGNOSIS — Z6830 Body mass index (BMI) 30.0-30.9, adult: Secondary | ICD-10-CM | POA: Diagnosis not present

## 2017-04-11 DIAGNOSIS — R05 Cough: Secondary | ICD-10-CM | POA: Diagnosis not present

## 2017-04-11 DIAGNOSIS — R040 Epistaxis: Secondary | ICD-10-CM | POA: Diagnosis not present

## 2017-04-25 DIAGNOSIS — F5221 Male erectile disorder: Secondary | ICD-10-CM | POA: Diagnosis not present

## 2017-04-25 DIAGNOSIS — R05 Cough: Secondary | ICD-10-CM | POA: Diagnosis not present

## 2017-04-25 DIAGNOSIS — J09X2 Influenza due to identified novel influenza A virus with other respiratory manifestations: Secondary | ICD-10-CM | POA: Diagnosis not present

## 2017-04-25 DIAGNOSIS — E291 Testicular hypofunction: Secondary | ICD-10-CM | POA: Diagnosis not present

## 2017-04-25 DIAGNOSIS — I10 Essential (primary) hypertension: Secondary | ICD-10-CM | POA: Diagnosis not present

## 2017-04-25 DIAGNOSIS — K219 Gastro-esophageal reflux disease without esophagitis: Secondary | ICD-10-CM | POA: Diagnosis not present

## 2017-04-25 DIAGNOSIS — Z6830 Body mass index (BMI) 30.0-30.9, adult: Secondary | ICD-10-CM | POA: Diagnosis not present

## 2017-04-25 DIAGNOSIS — R253 Fasciculation: Secondary | ICD-10-CM | POA: Diagnosis not present

## 2017-04-25 DIAGNOSIS — Z23 Encounter for immunization: Secondary | ICD-10-CM | POA: Diagnosis not present

## 2017-04-25 DIAGNOSIS — R040 Epistaxis: Secondary | ICD-10-CM | POA: Diagnosis not present

## 2017-04-25 DIAGNOSIS — E782 Mixed hyperlipidemia: Secondary | ICD-10-CM | POA: Diagnosis not present

## 2017-04-27 DIAGNOSIS — R8290 Unspecified abnormal findings in urine: Secondary | ICD-10-CM | POA: Diagnosis not present

## 2017-04-27 DIAGNOSIS — E291 Testicular hypofunction: Secondary | ICD-10-CM | POA: Diagnosis not present

## 2017-04-27 DIAGNOSIS — Z6829 Body mass index (BMI) 29.0-29.9, adult: Secondary | ICD-10-CM | POA: Diagnosis not present

## 2017-04-27 DIAGNOSIS — E782 Mixed hyperlipidemia: Secondary | ICD-10-CM | POA: Diagnosis not present

## 2017-05-05 ENCOUNTER — Other Ambulatory Visit (HOSPITAL_COMMUNITY): Payer: Self-pay | Admitting: Internal Medicine

## 2017-05-09 ENCOUNTER — Ambulatory Visit (HOSPITAL_COMMUNITY)
Admission: RE | Admit: 2017-05-09 | Discharge: 2017-05-09 | Disposition: A | Discharge status: Home / Self Care | Payer: Medicare Other | Source: Ambulatory Visit | Attending: Internal Medicine | Admitting: Internal Medicine

## 2017-05-09 DIAGNOSIS — I77811 Abdominal aortic ectasia: Secondary | ICD-10-CM | POA: Diagnosis not present

## 2017-05-09 DIAGNOSIS — K828 Other specified diseases of gallbladder: Secondary | ICD-10-CM | POA: Diagnosis not present

## 2017-05-12 ENCOUNTER — Telehealth (INDEPENDENT_AMBULATORY_CARE_PROVIDER_SITE_OTHER): Payer: Self-pay | Admitting: Internal Medicine

## 2017-05-12 NOTE — Telephone Encounter (Signed)
Please call [facility] at [phone]

## 2017-05-17 NOTE — Telephone Encounter (Signed)
I have spoken with patient's wife

## 2017-05-25 ENCOUNTER — Ambulatory Visit (INDEPENDENT_AMBULATORY_CARE_PROVIDER_SITE_OTHER): Payer: Medicare Other | Admitting: Internal Medicine

## 2017-05-25 ENCOUNTER — Encounter (INDEPENDENT_AMBULATORY_CARE_PROVIDER_SITE_OTHER): Payer: Self-pay | Admitting: Internal Medicine

## 2017-05-25 ENCOUNTER — Encounter (INDEPENDENT_AMBULATORY_CARE_PROVIDER_SITE_OTHER): Payer: Self-pay | Admitting: *Deleted

## 2017-05-25 VITALS — BP 150/84 | HR 64 | Temp 97.5°F | Ht 73.0 in | Wt 217.6 lb

## 2017-05-25 DIAGNOSIS — R17 Unspecified jaundice: Secondary | ICD-10-CM

## 2017-05-25 DIAGNOSIS — K760 Fatty (change of) liver, not elsewhere classified: Secondary | ICD-10-CM

## 2017-05-25 LAB — HEPATIC FUNCTION PANEL
AG RATIO: 1.9 (calc) (ref 1.0–2.5)
ALT: 20 U/L (ref 9–46)
AST: 18 U/L (ref 10–35)
Albumin: 4.5 g/dL (ref 3.6–5.1)
Alkaline phosphatase (APISO): 79 U/L (ref 40–115)
Bilirubin, Direct: 0.3 mg/dL — ABNORMAL HIGH (ref 0.0–0.2)
GLOBULIN: 2.4 g/dL (ref 1.9–3.7)
Indirect Bilirubin: 0.9 mg/dL (calc) (ref 0.2–1.2)
Total Bilirubin: 1.2 mg/dL (ref 0.2–1.2)
Total Protein: 6.9 g/dL (ref 6.1–8.1)

## 2017-05-25 NOTE — Progress Notes (Signed)
   Subjective:    Patient ID: Andrew Mcgrath, male    DOB: 1940-10-28, 77 y.o.   MRN: [id number]  HPI Referred by Dr. Merlyn Albert for fatty liver/hyperbilirubin.  04/08/2017 total bili 2.2, ALP 82, AST 20, ALT 24. 04/25/2017 total bili 1.1, ALP 75, AST 17, ALT 16. 03/29/2017 H and H 15.7 and 45.2, Platelet ct 154. He had flu in February. Urine has been dark. Lipitor on hold x 1 month  He had pain left mid abdomen x 2 day. Pain may last 10 seconds which would come and go. Dull pain yesterday to abdomen. No pain today.  He has weight loss but has been intentional.  Has lost about 13 pounds. Wife is worried about his pancreas which was not seen on recent US. Appetite is good. No change in his BMs.   05/09/2017 US abdomen complete IMPRESSION: 1. Sludge in gallbladder. No gallstones evident. No gallbladder wall thickening or pericholecystic fluid.  2. Diffuse increase in liver echogenicity, a finding indicative of hepatic steatosis. While no focal liver lesions are evident on this study, it must be cautioned that sensitivity of ultrasound for detection of focal liver lesions is diminished in this circumstance.   Review of Systems     Past Medical History:  Diagnosis Date  . Familial restless leg syndrome   . GERD (gastroesophageal reflux disease)   . Hyperlipidemia     Past Surgical History:  Procedure Laterality Date  . APPENDECTOMY    . CARDIAC CATHETERIZATION    . COLONOSCOPY    . COLONOSCOPY N/A 08/23/2013   Procedure: COLONOSCOPY;  Surgeon: Rogene Houston, MD;  Location: [facility];  Service: Endoscopy;  Laterality: N/A;  1200  . TONSILLECTOMY      No Known Allergies  Current Outpatient Medications on File Prior to Visit  Medication Sig Dispense Refill  . aspirin EC 81 MG tablet Take 81 mg by mouth daily.    Marland Kitchen omeprazole (PRILOSEC) 20 MG capsule Take 20 mg by mouth daily.     No current facility-administered medications on file prior to visit.      Objective:   Physical Exam Blood pressure (!) 150/84, pulse 64, temperature (!) 97.5 F (36.4 C), height 6\' 1"  (1.854 m), weight 217 lb 9.6 oz (98.7 kg). Alert and oriented. Skin warm and dry. Oral mucosa is moist.   . Sclera anicteric, conjunctivae is pink. Thyroid not enlarged. No cervical lymphadenopathy. Lungs clear. Heart regular rate and rhythm.  Abdomen is soft. Bowel sounds are positive. No hepatomegaly. No abdominal masses felt. No tenderness.  No edema to lower extremities.           Assessment & Plan:  Fatty liver, Elevated bilirubin, CT abdomen/pelvis with CM. Bilirubin has normalized probably elevated from a viral illness he had. Further recommendations to follow.

## 2017-06-06 DIAGNOSIS — Z6828 Body mass index (BMI) 28.0-28.9, adult: Secondary | ICD-10-CM | POA: Diagnosis not present

## 2017-06-06 DIAGNOSIS — M79651 Pain in right thigh: Secondary | ICD-10-CM | POA: Diagnosis not present

## 2017-06-08 DIAGNOSIS — L570 Actinic keratosis: Secondary | ICD-10-CM | POA: Diagnosis not present

## 2017-06-10 ENCOUNTER — Ambulatory Visit (HOSPITAL_COMMUNITY)
Admission: RE | Admit: 2017-06-10 | Discharge: 2017-06-10 | Disposition: A | Discharge status: Home / Self Care | Payer: Medicare Other | Source: Ambulatory Visit | Attending: Internal Medicine | Admitting: Internal Medicine

## 2017-06-10 ENCOUNTER — Encounter (HOSPITAL_COMMUNITY): Payer: Self-pay

## 2017-06-10 DIAGNOSIS — R17 Unspecified jaundice: Secondary | ICD-10-CM | POA: Diagnosis present

## 2017-06-10 DIAGNOSIS — I700 Atherosclerosis of aorta: Secondary | ICD-10-CM | POA: Insufficient documentation

## 2017-06-10 DIAGNOSIS — K760 Fatty (change of) liver, not elsewhere classified: Secondary | ICD-10-CM | POA: Insufficient documentation

## 2017-06-10 DIAGNOSIS — K7689 Other specified diseases of liver: Secondary | ICD-10-CM | POA: Insufficient documentation

## 2017-06-10 LAB — POCT I-STAT CREATININE: Creatinine, Ser: 1.3 mg/dL — ABNORMAL HIGH (ref 0.61–1.24)

## 2017-06-10 MED ORDER — IOPAMIDOL (ISOVUE-300) INJECTION 61%
100.0000 mL | Freq: Once | INTRAVENOUS | Status: AC | PRN
  Administered 2017-06-10: 100 mL via INTRAVENOUS

## 2017-06-13 ENCOUNTER — Other Ambulatory Visit (INDEPENDENT_AMBULATORY_CARE_PROVIDER_SITE_OTHER): Payer: Self-pay | Admitting: *Deleted

## 2017-06-13 ENCOUNTER — Encounter (INDEPENDENT_AMBULATORY_CARE_PROVIDER_SITE_OTHER): Payer: Self-pay | Admitting: *Deleted

## 2017-06-13 DIAGNOSIS — R17 Unspecified jaundice: Secondary | ICD-10-CM

## 2017-06-13 DIAGNOSIS — K760 Fatty (change of) liver, not elsewhere classified: Secondary | ICD-10-CM

## 2017-07-11 DIAGNOSIS — K760 Fatty (change of) liver, not elsewhere classified: Secondary | ICD-10-CM | POA: Diagnosis not present

## 2017-07-11 DIAGNOSIS — R17 Unspecified jaundice: Secondary | ICD-10-CM | POA: Diagnosis not present

## 2017-07-14 ENCOUNTER — Telehealth (INDEPENDENT_AMBULATORY_CARE_PROVIDER_SITE_OTHER): Payer: Self-pay | Admitting: Internal Medicine

## 2017-07-14 NOTE — Telephone Encounter (Signed)
Please call patient with lab results - ph# [phone]

## 2017-07-18 NOTE — Telephone Encounter (Signed)
No answer, will call again later. 

## 2017-07-18 NOTE — Telephone Encounter (Signed)
Results left on answering machine 

## 2017-08-02 ENCOUNTER — Encounter (HOSPITAL_COMMUNITY): Payer: Self-pay | Admitting: Emergency Medicine

## 2017-08-02 ENCOUNTER — Emergency Department (HOSPITAL_COMMUNITY)
Admission: EM | Admit: 2017-08-02 | Discharge: 2017-08-02 | Disposition: A | Discharge status: Home / Self Care | Payer: Medicare Other | Attending: Emergency Medicine | Admitting: Emergency Medicine

## 2017-08-02 DIAGNOSIS — Z7982 Long term (current) use of aspirin: Secondary | ICD-10-CM | POA: Insufficient documentation

## 2017-08-02 DIAGNOSIS — Z87891 Personal history of nicotine dependence: Secondary | ICD-10-CM | POA: Diagnosis not present

## 2017-08-02 DIAGNOSIS — T63441A Toxic effect of venom of bees, accidental (unintentional), initial encounter: Secondary | ICD-10-CM | POA: Insufficient documentation

## 2017-08-02 DIAGNOSIS — R221 Localized swelling, mass and lump, neck: Secondary | ICD-10-CM | POA: Diagnosis not present

## 2017-08-02 DIAGNOSIS — Z79899 Other long term (current) drug therapy: Secondary | ICD-10-CM | POA: Insufficient documentation

## 2017-08-02 DIAGNOSIS — M542 Cervicalgia: Secondary | ICD-10-CM | POA: Diagnosis not present

## 2017-08-02 MED ORDER — FAMOTIDINE IN NACL 20-0.9 MG/50ML-% IV SOLN
20.0000 mg | Freq: Once | INTRAVENOUS | Status: AC
  Administered 2017-08-02: 20 mg via INTRAVENOUS
  Filled 2017-08-02: qty 50

## 2017-08-02 MED ORDER — DIPHENHYDRAMINE HCL 50 MG/ML IJ SOLN
25.0000 mg | Freq: Once | INTRAMUSCULAR | Status: AC
  Administered 2017-08-02: 25 mg via INTRAVENOUS
  Filled 2017-08-02: qty 1

## 2017-08-02 MED ORDER — KETOROLAC TROMETHAMINE 30 MG/ML IJ SOLN
15.0000 mg | Freq: Once | INTRAMUSCULAR | Status: AC
  Administered 2017-08-02: 15 mg via INTRAVENOUS
  Filled 2017-08-02: qty 1

## 2017-08-02 NOTE — Discharge Instructions (Addendum)
Take Benadryl for itching or swelling and follow-up as needed

## 2017-08-02 NOTE — ED Provider Notes (Signed)
[facility] Provider Note   CSN: [id number] Arrival date & time: 08/02/17  1107     History   Chief Complaint Chief Complaint  Patient presents with  . Insect Bite    HPI Andrew Mcgrath is a 77 y.o. male.  Patient was stung on the right side of the neck with a BB.  Patient does not have a history of allergies but did complain of pain.  The history is provided by the patient. No language interpreter was used.  Illness  This is a new problem. The current episode started less than 1 hour ago. The problem occurs rarely. The problem has been resolved. Pertinent negatives include no chest pain, no abdominal pain and no headaches. Nothing aggravates the symptoms. Nothing relieves the symptoms.    Past Medical History:  Diagnosis Date  . Familial restless leg syndrome   . GERD (gastroesophageal reflux disease)   . Hyperlipidemia     There are no active problems to display for this patient.   Past Surgical History:  Procedure Laterality Date  . APPENDECTOMY    . CARDIAC CATHETERIZATION    . COLONOSCOPY    . COLONOSCOPY N/A 08/23/2013   Procedure: COLONOSCOPY;  Surgeon: Rogene Houston, MD;  Location: [facility];  Service: Endoscopy;  Laterality: N/A;  1200  . TONSILLECTOMY          Home Medications    Prior to Admission medications   Medication Sig Start Date End Date Taking? Authorizing Provider  aspirin EC 81 MG tablet Take 81 mg by mouth daily.   Yes [provider]  omeprazole (PRILOSEC) 20 MG capsule Take 20 mg by mouth daily.   Yes [provider]  Polyethyl Glycol-Propyl Glycol (SYSTANE) 0.4-0.3 % SOLN Place 1-2 drops into both eyes as needed.   Yes [provider]  testosterone cypionate (DEPOTESTOSTERONE CYPIONATE) 200 MG/ML injection Inject 200 mg into the muscle every 21 ( twenty-one) days.   Yes [provider]    Family History Family History  Problem Relation Age of Onset  . Colon cancer  Neg Hx     Social History Social History   Tobacco Use  . Smoking status: Former Smoker    Packs/day: 1.50    Years: 20.00    Pack years: 30.00    Types: Cigarettes  . Smokeless tobacco: Never Used  Substance Use Topics  . Alcohol use: Yes    Comment: wine  . Drug use: No     Allergies   Patient has no known allergies.   Review of Systems Review of Systems  Constitutional: Negative for appetite change and fatigue.  HENT: Negative for congestion, ear discharge and sinus pressure.        Neck pain  Eyes: Negative for discharge.  Respiratory: Negative for cough.   Cardiovascular: Negative for chest pain.  Gastrointestinal: Negative for abdominal pain and diarrhea.  Genitourinary: Negative for frequency and hematuria.  Musculoskeletal: Negative for back pain.  Skin: Negative for rash.  Neurological: Negative for seizures and headaches.  Psychiatric/Behavioral: Negative for hallucinations.     Physical Exam Updated Vital Signs BP 125/77 (BP Location: Left Arm)   Pulse 98   Temp 98.6 F (37 C) (Oral)   Resp 20   Ht 6\' 1"  (1.854 m)   Wt 95.3 kg (210 lb)   SpO2 95%   BMI 27.71 kg/m   Physical Exam  Constitutional: He is oriented to person, place, and time. He appears well-developed.  HENT:  Head: Normocephalic.  Minimal swelling and tenderness to right lateral neck where the bee stung him  Eyes: Conjunctivae and EOM are normal. No scleral icterus.  Neck: Neck supple. No thyromegaly present.  Cardiovascular: Normal rate and regular rhythm. Exam reveals no gallop and no friction rub.  No murmur heard. Pulmonary/Chest: No stridor. He has no wheezes. He has no rales. He exhibits no tenderness.  Abdominal: He exhibits no distension. There is no tenderness. There is no rebound.  Musculoskeletal: Normal range of motion. He exhibits no edema.  Lymphadenopathy:    He has no cervical adenopathy.  Neurological: He is oriented to person, place, and time. He exhibits  normal muscle tone. Coordination normal.  Skin: No rash noted. No erythema.  Psychiatric: He has a normal mood and affect. His behavior is normal.     ED Treatments / Results  Labs (all labs ordered are listed, but only abnormal results are displayed) Labs Reviewed - No data to display  EKG None  Radiology No results found.  Procedures Procedures (including critical care time)  Medications Ordered in ED Medications  diphenhydrAMINE (BENADRYL) injection 25 mg (25 mg Intravenous Given 08/02/17 1215)  famotidine (PEPCID) IVPB 20 mg premix (20 mg Intravenous New Bag/Given 08/02/17 1216)  ketorolac (TORADOL) 30 MG/ML injection 15 mg (15 mg Intravenous Given 08/02/17 1215)     Initial Impression / Assessment and Plan / ED Course  I have reviewed the triage vital signs and the nursing notes.  Pertinent labs & imaging results that were available during my care of the patient were reviewed by me and considered in my medical decision making (see chart for details).     She was observed in the emergency department for couple hours.  He did not have a systemic reaction.  Just local reaction.  He is treated with Benadryl will follow-up as needed  Final Clinical Impressions(s) / ED Diagnoses   Final diagnoses:  Bee sting, accidental or unintentional, initial encounter    ED Discharge Orders    None       Milton Ferguson, MD 08/02/17 1355

## 2017-08-02 NOTE — ED Triage Notes (Signed)
Patient states he was stung by a bee prior to arrival to ED. Complaining of pain to area on right side of neck and states patient is radiating into his head. States he is not allergic to bees.

## 2017-08-10 ENCOUNTER — Other Ambulatory Visit: Payer: Self-pay

## 2017-08-10 ENCOUNTER — Observation Stay (HOSPITAL_COMMUNITY)
Admission: EM | Admit: 2017-08-10 | Discharge: 2017-08-10 | Disposition: A | Discharge status: Home / Self Care | Payer: Medicare Other | Attending: Emergency Medicine | Admitting: Emergency Medicine

## 2017-08-10 ENCOUNTER — Encounter (HOSPITAL_COMMUNITY): Payer: Self-pay | Admitting: Emergency Medicine

## 2017-08-10 ENCOUNTER — Emergency Department (HOSPITAL_COMMUNITY): Payer: Medicare Other

## 2017-08-10 DIAGNOSIS — R221 Localized swelling, mass and lump, neck: Secondary | ICD-10-CM | POA: Diagnosis not present

## 2017-08-10 DIAGNOSIS — Y929 Unspecified place or not applicable: Secondary | ICD-10-CM | POA: Insufficient documentation

## 2017-08-10 DIAGNOSIS — E785 Hyperlipidemia, unspecified: Secondary | ICD-10-CM | POA: Diagnosis not present

## 2017-08-10 DIAGNOSIS — G2581 Restless legs syndrome: Secondary | ICD-10-CM | POA: Insufficient documentation

## 2017-08-10 DIAGNOSIS — T63441A Toxic effect of venom of bees, accidental (unintentional), initial encounter: Secondary | ICD-10-CM | POA: Diagnosis not present

## 2017-08-10 DIAGNOSIS — Z87891 Personal history of nicotine dependence: Secondary | ICD-10-CM | POA: Diagnosis not present

## 2017-08-10 DIAGNOSIS — Z7982 Long term (current) use of aspirin: Secondary | ICD-10-CM | POA: Insufficient documentation

## 2017-08-10 DIAGNOSIS — Z79899 Other long term (current) drug therapy: Secondary | ICD-10-CM | POA: Insufficient documentation

## 2017-08-10 DIAGNOSIS — R21 Rash and other nonspecific skin eruption: Secondary | ICD-10-CM | POA: Diagnosis not present

## 2017-08-10 DIAGNOSIS — K219 Gastro-esophageal reflux disease without esophagitis: Secondary | ICD-10-CM | POA: Insufficient documentation

## 2017-08-10 DIAGNOSIS — T7840XA Allergy, unspecified, initial encounter: Secondary | ICD-10-CM

## 2017-08-10 DIAGNOSIS — R220 Localized swelling, mass and lump, head: Secondary | ICD-10-CM | POA: Diagnosis not present

## 2017-08-10 LAB — CBC WITH DIFFERENTIAL/PLATELET
Basophils Absolute: 0 10*3/uL (ref 0.0–0.1)
Basophils Relative: 0 %
Eosinophils Absolute: 0.1 10*3/uL (ref 0.0–0.7)
Eosinophils Relative: 1 %
HEMATOCRIT: 48.8 % (ref 39.0–52.0)
HEMOGLOBIN: 16.4 g/dL (ref 13.0–17.0)
LYMPHS ABS: 1.4 10*3/uL (ref 0.7–4.0)
Lymphocytes Relative: 16 %
MCH: 29.4 pg (ref 26.0–34.0)
MCHC: 33.6 g/dL (ref 30.0–36.0)
MCV: 87.6 fL (ref 78.0–100.0)
MONOS PCT: 6 %
Monocytes Absolute: 0.5 10*3/uL (ref 0.1–1.0)
NEUTROS ABS: 6.5 10*3/uL (ref 1.7–7.7)
NEUTROS PCT: 77 %
Platelets: 162 10*3/uL (ref 150–400)
RBC: 5.57 MIL/uL (ref 4.22–5.81)
RDW: 14.6 % (ref 11.5–15.5)
WBC: 8.5 10*3/uL (ref 4.0–10.5)

## 2017-08-10 LAB — BASIC METABOLIC PANEL
Anion gap: 9 (ref 5–15)
BUN: 14 mg/dL (ref 8–23)
CALCIUM: 9.1 mg/dL (ref 8.9–10.3)
CO2: 29 mmol/L (ref 22–32)
Chloride: 101 mmol/L (ref 98–111)
Creatinine, Ser: 1.2 mg/dL (ref 0.61–1.24)
GFR, EST NON AFRICAN AMERICAN: 57 mL/min — AB (ref >60)
Glucose, Bld: 108 mg/dL — ABNORMAL HIGH (ref 70–99)
POTASSIUM: 3.8 mmol/L (ref 3.5–5.1)
Sodium: 139 mmol/L (ref 135–145)

## 2017-08-10 MED ORDER — DIPHENHYDRAMINE HCL 25 MG PO TABS: 25.0000 mg | ORAL_TABLET | Freq: Four times a day (QID) | ORAL | 0 refills | Status: DC

## 2017-08-10 MED ORDER — PREDNISONE 10 MG (21) PO TBPK: ORAL_TABLET | Freq: Every day | ORAL | 0 refills | Status: DC

## 2017-08-10 MED ORDER — DIPHENHYDRAMINE HCL 50 MG/ML IJ SOLN
12.5000 mg | Freq: Once | INTRAMUSCULAR | Status: AC
  Administered 2017-08-10: 12.5 mg via INTRAVENOUS
  Filled 2017-08-10: qty 1

## 2017-08-10 MED ORDER — EPINEPHRINE 0.3 MG/0.3ML IJ SOAJ: 0.3000 mg | Freq: Once | INTRAMUSCULAR | 0 refills | Status: AC

## 2017-08-10 MED ORDER — FAMOTIDINE IN NACL 20-0.9 MG/50ML-% IV SOLN
20.0000 mg | Freq: Once | INTRAVENOUS | Status: AC
  Administered 2017-08-10: 20 mg via INTRAVENOUS
  Filled 2017-08-10: qty 50

## 2017-08-10 MED ORDER — FAMOTIDINE 20 MG PO TABS: 20.0000 mg | ORAL_TABLET | Freq: Every day | ORAL | 0 refills | Status: DC

## 2017-08-10 MED ORDER — IOHEXOL 300 MG/ML  SOLN
75.0000 mL | Freq: Once | INTRAMUSCULAR | Status: AC | PRN
  Administered 2017-08-10: 75 mL via INTRAVENOUS

## 2017-08-10 MED ORDER — METHYLPREDNISOLONE SODIUM SUCC 125 MG IJ SOLR
125.0000 mg | Freq: Once | INTRAMUSCULAR | Status: AC
  Administered 2017-08-10: 125 mg via INTRAVENOUS
  Filled 2017-08-10: qty 2

## 2017-08-10 NOTE — ED Provider Notes (Signed)
[facility] EMERGENCY DEPARTMENT Provider Note   CSN: [id number] Arrival date & time: 08/10/17  0316     History   Chief Complaint Chief Complaint  Patient presents with  . neck swelling    HPI Andrew Mcgrath is a 77 y.o. male.  Patient presents with pain swelling and itching and redness to his anterior neck and chest.  States this started around midnight with itching as well as "whelps" developing.  He took some Benadryl and applied Benadryl cream without relief.  He complains of itching and swelling to his anterior neck and chest.  Denies any difficulty breathing or difficulty swallowing.  No chest pain or shortness of breath.  Notably he was seen in the ED a week ago for a bee sting to his anterior neck which resolved with Benadryl.  He denies any recurrent bee stings or other new exposures.  He is wearing a new shirt but states the swelling started before he put the shirt on.  He denies any new foods, lotions, detergents or medications.  Does not spend excessive amount time in the sun.  The history is provided by the patient.    Past Medical History:  Diagnosis Date  . Familial restless leg syndrome   . GERD (gastroesophageal reflux disease)   . Hyperlipidemia     There are no active problems to display for this patient.   Past Surgical History:  Procedure Laterality Date  . APPENDECTOMY    . CARDIAC CATHETERIZATION    . COLONOSCOPY    . COLONOSCOPY N/A 08/23/2013   Procedure: COLONOSCOPY;  Surgeon: Rogene Houston, MD;  Location: [facility];  Service: Endoscopy;  Laterality: N/A;  1200  . TONSILLECTOMY          Home Medications    Prior to Admission medications   Medication Sig Start Date End Date Taking? Authorizing Provider  aspirin EC 81 MG tablet Take 81 mg by mouth daily.    [provider]  omeprazole (PRILOSEC) 20 MG capsule Take 20 mg by mouth daily.    [provider]  Polyethyl Glycol-Propyl Glycol (SYSTANE) 0.4-0.3 % SOLN  Place 1-2 drops into both eyes as needed.    [provider]  testosterone cypionate (DEPOTESTOSTERONE CYPIONATE) 200 MG/ML injection Inject 200 mg into the muscle every 21 ( twenty-one) days.    [provider]    Family History Family History  Problem Relation Age of Onset  . Colon cancer Neg Hx     Social History Social History   Tobacco Use  . Smoking status: Former Smoker    Packs/day: 1.50    Years: 20.00    Pack years: 30.00    Types: Cigarettes  . Smokeless tobacco: Never Used  Substance Use Topics  . Alcohol use: Yes    Comment: wine  . Drug use: No     Allergies   Patient has no known allergies.   Review of Systems Review of Systems  Constitutional: Negative for activity change, appetite change and fever.  HENT: Positive for facial swelling. Negative for congestion, postnasal drip, rhinorrhea and trouble swallowing.   Eyes: Negative for visual disturbance.  Respiratory: Negative for cough, chest tightness and shortness of breath.   Gastrointestinal: Negative for abdominal pain, nausea and vomiting.  Genitourinary: Negative for dysuria and hematuria.  Musculoskeletal: Negative for arthralgias and back pain.  Skin: Positive for rash.  Neurological: Negative for dizziness, weakness and headaches.    all other systems are negative except as  noted in the HPI and PMH.    Physical Exam Updated Vital Signs BP (!) 156/92   Pulse 75   Temp 98.3 F (36.8 C)   Resp 18   Ht 6\' 1"  (1.854 m)   Wt 95.3 kg (210 lb)   SpO2 98%   BMI 27.71 kg/m   Physical Exam  Constitutional: He is oriented to person, place, and time. He appears well-developed and well-nourished. No distress.  Speaking full sentences no stridor or respiratory distress  HENT:  Head: Normocephalic and atraumatic.  Mouth/Throat: Oropharynx is clear and moist. No oropharyngeal exudate.  Diffuse erythema and swelling of anterior neck extending onto anterior chest without  fluctuance.  No tongue or lip swelling  Eyes: Pupils are equal, round, and reactive to light. Conjunctivae and EOM are normal.  Neck: Normal range of motion. Neck supple.  No meningismus.  Cardiovascular: Normal rate, regular rhythm, normal heart sounds and intact distal pulses.  No murmur heard. Pulmonary/Chest: Effort normal and breath sounds normal. No respiratory distress. He exhibits no tenderness.  Abdominal: Soft. There is no tenderness. There is no rebound and no guarding.  Musculoskeletal: Normal range of motion. He exhibits no edema or tenderness.  Neurological: He is alert and oriented to person, place, and time. No cranial nerve deficit. He exhibits normal muscle tone. Coordination normal.   5/5 strength throughout. CN 2-12 intact.Equal grip strength.   Skin: Skin is warm.  Psychiatric: He has a normal mood and affect. His behavior is normal.  Nursing note and vitals reviewed.    ED Treatments / Results  Labs (all labs ordered are listed, but only abnormal results are displayed) Labs Reviewed  BASIC METABOLIC PANEL - Abnormal; Notable for the following components:      Result Value   Glucose, Bld 108 (*)    GFR calc non Af Amer 57 (*)    All other components within normal limits  CBC WITH DIFFERENTIAL/PLATELET    EKG None  Radiology Ct Soft Tissue Neck W Contrast  Result Date: 08/10/2017 CLINICAL DATA:  77 y/o M; bee sting to neck with persistent swelling. EXAM: CT NECK WITH CONTRAST TECHNIQUE: Multidetector CT imaging of the neck was performed using the standard protocol following the bolus administration of intravenous contrast. CONTRAST:  62mL OMNIPAQUE IOHEXOL 300 MG/ML  SOLN COMPARISON:  None. FINDINGS: Pharynx and larynx: Normal. No mass or swelling. Salivary glands: No inflammation, mass, or stone. Thyroid: Normal. Lymph nodes: None enlarged or abnormal density. Vascular: Negative. Limited intracranial: Negative. Visualized orbits: Negative. Mastoids and  visualized paranasal sinuses: Clear. Skeleton: No acute or aggressive process. Upper chest: Negative. Other: Diffuse inflammation throughout the subcutaneous fat of the anterior neck, midline upper chest, anterior cervical triangles, and submandibular space. No extension of inflammation into the deep cervical compartments or paravertebral compartment. No abscess. IMPRESSION: Diffuse inflammation throughout the subcutaneous fat of the anterior neck, midline upper chest, anterior cervical triangles, and submandibular spaces. No extension of inflammation into the deep cervical compartments or paravertebral compartments. No abscess. Electronically Signed   By: Kristine Garbe M.D.   On: 08/10/2017 05:37    Procedures Procedures (including critical care time)  Medications Ordered in ED Medications  diphenhydrAMINE (BENADRYL) injection 12.5 mg (has no administration in time range)  famotidine (PEPCID) IVPB 20 mg premix (has no administration in time range)  methylPREDNISolone sodium succinate (SOLU-MEDROL) 125 mg/2 mL injection 125 mg (has no administration in time range)     Initial Impression / Assessment and Plan / ED  Course  I have reviewed the triage vital signs and the nursing notes.  Pertinent labs & imaging results that were available during my care of the patient were reviewed by me and considered in my medical decision making (see chart for details).    Patient with apparent allergic reaction of unknown etiology involving anterior neck and chest skin.  There is no tongue or lip swelling or difficulty breathing or swallowing.  No wheezing.  Patient will be given steroids and antihistamines and observed.  Patient with persistent swelling and redness to his neck after above measures.  There is no tongue or lip swelling.  He does not take any ACE inhibitors.  CT scan was obtained that shows no airway involvement does show inflammation of soft tissues.  Initial plan was for  observation admission given persistent swelling of neck area.  Discussed with Dr. Olevia Bowens.  At 8 AM, patient has been seen by Dr. Wynetta Emery and is requesting to go home.  His swelling of his neck is much improved and he has no difficulty breathing or difficulty swallowing.  No tongue or lip swelling.  Patient swelling has significantly improved.  He request to go home and states he will return if worse.  Will prescribe steroids and antihistamines.  We will also give epinephrine pen with instructions for use.  Patient aware that we are uncertain what is the etiology of his reaction.  Return precautions discussed.  CRITICAL CARE Performed by: [name] Total critical care time: 32 minutes Critical care time was exclusive of separately billable procedures and treating other patients. Critical care was necessary to treat or prevent imminent or life-threatening deterioration. Critical care was time spent personally by me on the following activities: development of treatment plan with patient and/or surrogate as well as nursing, discussions with consultants, evaluation of patient's response to treatment, examination of patient, obtaining history from patient or surrogate, ordering and performing treatments and interventions, ordering and review of laboratory studies, ordering and review of radiographic studies, pulse oximetry and re-evaluation of patient's condition.  Final Clinical Impressions(s) / ED Diagnoses   Final diagnoses:  Allergic reaction, initial encounter    ED Discharge Orders    None       Calisha Tindel, Annie Main, MD 08/10/17 [phone]

## 2017-08-10 NOTE — ED Notes (Signed)
Pt up to restroom with no distress noted. Ambulatory with no difficulty.

## 2017-08-10 NOTE — Discharge Instructions (Addendum)
Take the steroids and antihistamines as prescribed.  Use the epinephrine pen only for difficulty breathing or difficulty swallowing, tongue or lip swelling.  You must come to the hospital if you use the epinephrine pen.  Return to the ED if you develop new or worsening symptoms.

## 2017-08-10 NOTE — ED Triage Notes (Signed)
Pt states he was treated one week ago for bee sting to neck and since then the area has swollen been red and itching.

## 2017-08-10 NOTE — ED Notes (Signed)
Patient transported to CT 

## 2017-08-10 NOTE — Consult Note (Signed)
[facility] Consult Note  Andrew Mcgrath CSN:[id number],MRN:[id number]   Patients out patient PCP is Andrew Squibb, MD Consult requested in the Hospital by No att. providers found, On 08/10/2017  Reason for consult evaluation and management of allergic reaction  With History of - Active Problems:   Allergic reaction to bee sting   Past Medical History:  Diagnosis Date  . Familial restless leg syndrome   . GERD (gastroesophageal reflux disease)   . Hyperlipidemia      Past Surgical History:  Procedure Laterality Date  . APPENDECTOMY    . CARDIAC CATHETERIZATION    . COLONOSCOPY    . COLONOSCOPY N/A 08/23/2013   Procedure: COLONOSCOPY;  Surgeon: Rogene Houston, MD;  Location: [facility];  Service: Endoscopy;  Laterality: N/A;  1200  . TONSILLECTOMY      Past Surgical History:  Procedure Laterality Date  . APPENDECTOMY    . CARDIAC CATHETERIZATION    . COLONOSCOPY    . COLONOSCOPY N/A 08/23/2013   Procedure: COLONOSCOPY;  Surgeon: Rogene Houston, MD;  Location: [facility];  Service: Endoscopy;  Laterality: N/A;  1200  . TONSILLECTOMY     HPI:-  Andrew Mcgrath LFY:[id number],ENI:[id number] is a 77 y.o. male who presented to the emergency department with pain and swelling and itching of the anterior neck and chest that started around midnight associated with itching and whelps.  He has taken some Benadryl over-the-counter without significant relief.  He denied any difficulty swallowing or breathing.  He had no shortness of breath or chest pain.  He had been seen in the ED 1 week prior with complaints of a bee sting to the anterior neck that was treated with Benadryl.  He was seen in the emergency department and given Benadryl and Solu-Medrol IV.  He was monitored in the ED and within the last hour his symptoms have dramatically improved the swelling has improved dramatically and the redness has completely disappeared.  The patient says that he has noticed a market  improvement in symptoms and is requesting that he be allowed to go home.  Review of Systems   Constitutional: Negative for activity change, appetite change and fever.  HENT: Positive for facial swelling. Negative for congestion, postnasal drip, rhinorrhea and trouble swallowing.   Eyes: Negative for visual disturbance.  Respiratory: Negative for cough, chest tightness and shortness of breath.   Gastrointestinal: Negative for abdominal pain, nausea and vomiting.  Genitourinary: Negative for dysuria and hematuria.  Musculoskeletal: Negative for arthralgias and back pain.  Skin: Positive for rash.  Neurological: Negative for dizziness, weakness and headaches.   all other systems are negative except as noted in the HPI and PMH.   Social History Social History   Tobacco Use  . Smoking status: Former Smoker    Packs/day: 1.50    Years: 20.00    Pack years: 30.00    Types: Cigarettes  . Smokeless tobacco: Never Used  Substance Use Topics  . Alcohol use: Yes    Comment: wine     Family History Family History  Problem Relation Age of Onset  . Colon cancer Neg Hx      Prior to Admission medications   Medication Sig Start Date End Date Taking? Authorizing Provider  aspirin EC 81 MG tablet Take 81 mg by mouth daily.    [provider]  diphenhydrAMINE (BENADRYL) 25 MG tablet Take 1 tablet (25 mg total) by mouth every 6 (six) hours. 08/10/17   [name], MD  EPINEPHrine (EPIPEN 2-PAK) 0.3 mg/0.3 mL IJ SOAJ injection Inject 0.3 mLs (0.3 mg total) into the muscle once for 1 dose. For severe difficulty breathing or swallowing only 08/10/17 08/10/17  Rancour, Annie Main, MD  famotidine (PEPCID) 20 MG tablet Take 1 tablet (20 mg total) by mouth daily. 08/10/17   Rancour, Annie Main, MD  omeprazole (PRILOSEC) 20 MG capsule Take 20 mg by mouth daily.    [provider]  Polyethyl Glycol-Propyl Glycol (SYSTANE) 0.4-0.3 % SOLN Place 1-2 drops into both eyes as needed.     [provider]  predniSONE (STERAPRED UNI-PAK 21 TAB) 10 MG (21) TBPK tablet Take by mouth daily. Take 6 tabs by mouth daily  for 2 days, then 5 tabs for 2 days, then 4 tabs for 2 days, then 3 tabs for 2 days, 2 tabs for 2 days, then 1 tab by mouth daily for 2 days 08/10/17   [name], MD  testosterone cypionate (DEPOTESTOSTERONE CYPIONATE) 200 MG/ML injection Inject 200 mg into the muscle every 21 ( twenty-one) days.    [provider]    No Known Allergies  Physical Exam  Intake/Output Summary (Last 24 hours) at 08/10/2017 0831 Last data filed at 08/10/2017 0434 Gross per 24 hour  Intake 50 ml  Output -  Net 50 ml   Blood pressure 132/83, pulse 82, temperature 98.2 F (36.8 C), temperature source Oral, resp. rate 18, height 6\' 1"  (1.854 m), weight 95.3 kg (210 lb), SpO2 95 %.  Constitutional: He is oriented to person, place, and time. He appears well-developed and well-nourished. No distress. Speaking full sentences no stridor or respiratory distress  HENT: Head: Normocephalic and atraumatic.  Mouth/Throat: Oropharynx is clear and moist. No oropharyngeal exudate. No tongue or lip swelling  Eyes: Pupils are equal, round, and reactive to light. Conjunctivae and EOM are normal.  Neck: Normal range of motion. Neck supple. No meningismus.  Cardiovascular: Normal rate, regular rhythm, normal heart sounds and intact distal pulses. No murmur heard. Pulmonary/Chest: Effort normal and breath sounds normal. No respiratory distress. He exhibits no tenderness.  Abdominal: Soft. There is no tenderness. There is no rebound and no guarding.  Musculoskeletal: Normal range of motion. He exhibits no edema or tenderness.  Neurological: He is alert and oriented to person, place, and time. No cranial nerve deficit. He exhibits normal muscle tone. Coordination normal.   5/5 strength throughout. CN 2-12 intact.Equal grip strength.   Skin: Skin is warm.  Psychiatric: He has a normal  mood and affect. His behavior is normal.  Data Review CBC w Diff:  Lab Results  Component Value Date   WBC 8.5 08/10/2017   HGB 16.4 08/10/2017   HCT 48.8 08/10/2017   PLT 162 08/10/2017   LYMPHOPCT 16 08/10/2017   MONOPCT 6 08/10/2017   EOSPCT 1 08/10/2017   BASOPCT 0 08/10/2017    CMP:  Lab Results  Component Value Date   NA 139 08/10/2017   K 3.8 08/10/2017   CL 101 08/10/2017   CO2 29 08/10/2017   BUN 14 08/10/2017   CREATININE 1.20 08/10/2017   PROT 6.6 07/11/2017   ALBUMIN 4.2 04/08/2010   BILITOT 1.1 07/11/2017   ALKPHOS 61 04/08/2010   AST 16 07/11/2017   ALT 17 07/11/2017    Coagulation:  Lab Results  Component Value Date   INR 0.96 07/23/2010   Cardiac markers: No results found for: CKMB, TROPONINI, MYOGLOBIN  Assessment & Plan  1.  Allergic reaction-resolving now.  I spoke with  the ED physician and he came back to reevaluate patient and found that the patient has had a market improvement in symptoms.  The swelling is nearly completely resolved at this time.  The patient is requesting to go home at this time I think that is safe for him to discharge home.  The ED physician is planning to send him on steroids, antihistamines and an EpiPen to use for emergencies.  The patient was given clear instructions to return if symptoms come back, worsen or new problems develop.  The patient verbalized understanding.  His wife was at the bedside who also verbalized understanding.   Thank you for the consult.    [facility]

## 2017-08-10 NOTE — ED Notes (Signed)
Attempted call report 

## 2017-08-17 DIAGNOSIS — E291 Testicular hypofunction: Secondary | ICD-10-CM | POA: Diagnosis not present

## 2017-08-17 DIAGNOSIS — Z6830 Body mass index (BMI) 30.0-30.9, adult: Secondary | ICD-10-CM | POA: Diagnosis not present

## 2017-08-17 DIAGNOSIS — I10 Essential (primary) hypertension: Secondary | ICD-10-CM | POA: Diagnosis not present

## 2017-08-17 DIAGNOSIS — R040 Epistaxis: Secondary | ICD-10-CM | POA: Diagnosis not present

## 2017-08-17 DIAGNOSIS — J09X2 Influenza due to identified novel influenza A virus with other respiratory manifestations: Secondary | ICD-10-CM | POA: Diagnosis not present

## 2017-08-17 DIAGNOSIS — R253 Fasciculation: Secondary | ICD-10-CM | POA: Diagnosis not present

## 2017-08-17 DIAGNOSIS — E782 Mixed hyperlipidemia: Secondary | ICD-10-CM | POA: Diagnosis not present

## 2017-08-17 DIAGNOSIS — R05 Cough: Secondary | ICD-10-CM | POA: Diagnosis not present

## 2017-08-17 DIAGNOSIS — Z23 Encounter for immunization: Secondary | ICD-10-CM | POA: Diagnosis not present

## 2017-08-17 DIAGNOSIS — F5221 Male erectile disorder: Secondary | ICD-10-CM | POA: Diagnosis not present

## 2017-08-17 DIAGNOSIS — K219 Gastro-esophageal reflux disease without esophagitis: Secondary | ICD-10-CM | POA: Diagnosis not present

## 2017-08-23 DIAGNOSIS — N182 Chronic kidney disease, stage 2 (mild): Secondary | ICD-10-CM | POA: Diagnosis not present

## 2017-08-23 DIAGNOSIS — M791 Myalgia, unspecified site: Secondary | ICD-10-CM | POA: Diagnosis not present

## 2017-08-23 DIAGNOSIS — Z6827 Body mass index (BMI) 27.0-27.9, adult: Secondary | ICD-10-CM | POA: Diagnosis not present

## 2017-08-23 DIAGNOSIS — Z91030 Bee allergy status: Secondary | ICD-10-CM | POA: Diagnosis not present

## 2017-08-23 DIAGNOSIS — E291 Testicular hypofunction: Secondary | ICD-10-CM | POA: Diagnosis not present

## 2017-08-23 DIAGNOSIS — I10 Essential (primary) hypertension: Secondary | ICD-10-CM | POA: Diagnosis not present

## 2017-09-06 DIAGNOSIS — F419 Anxiety disorder, unspecified: Secondary | ICD-10-CM | POA: Diagnosis not present

## 2017-09-06 DIAGNOSIS — E782 Mixed hyperlipidemia: Secondary | ICD-10-CM | POA: Diagnosis not present

## 2017-09-06 DIAGNOSIS — E291 Testicular hypofunction: Secondary | ICD-10-CM | POA: Diagnosis not present

## 2017-09-06 DIAGNOSIS — R040 Epistaxis: Secondary | ICD-10-CM | POA: Diagnosis not present

## 2017-09-06 DIAGNOSIS — J09X2 Influenza due to identified novel influenza A virus with other respiratory manifestations: Secondary | ICD-10-CM | POA: Diagnosis not present

## 2017-09-06 DIAGNOSIS — Z6830 Body mass index (BMI) 30.0-30.9, adult: Secondary | ICD-10-CM | POA: Diagnosis not present

## 2017-09-06 DIAGNOSIS — Z6828 Body mass index (BMI) 28.0-28.9, adult: Secondary | ICD-10-CM | POA: Diagnosis not present

## 2017-09-06 DIAGNOSIS — Z6827 Body mass index (BMI) 27.0-27.9, adult: Secondary | ICD-10-CM | POA: Diagnosis not present

## 2017-09-06 DIAGNOSIS — I10 Essential (primary) hypertension: Secondary | ICD-10-CM | POA: Diagnosis not present

## 2017-09-06 DIAGNOSIS — R05 Cough: Secondary | ICD-10-CM | POA: Diagnosis not present

## 2017-09-06 DIAGNOSIS — N182 Chronic kidney disease, stage 2 (mild): Secondary | ICD-10-CM | POA: Diagnosis not present

## 2017-09-06 DIAGNOSIS — K219 Gastro-esophageal reflux disease without esophagitis: Secondary | ICD-10-CM | POA: Diagnosis not present

## 2017-09-06 DIAGNOSIS — M79651 Pain in right thigh: Secondary | ICD-10-CM | POA: Diagnosis not present

## 2017-09-06 DIAGNOSIS — R253 Fasciculation: Secondary | ICD-10-CM | POA: Diagnosis not present

## 2017-09-06 DIAGNOSIS — Z125 Encounter for screening for malignant neoplasm of prostate: Secondary | ICD-10-CM | POA: Diagnosis not present

## 2017-10-20 DIAGNOSIS — I10 Essential (primary) hypertension: Secondary | ICD-10-CM | POA: Diagnosis not present

## 2017-10-20 DIAGNOSIS — E782 Mixed hyperlipidemia: Secondary | ICD-10-CM | POA: Diagnosis not present

## 2017-10-25 DIAGNOSIS — E291 Testicular hypofunction: Secondary | ICD-10-CM | POA: Diagnosis not present

## 2017-10-25 DIAGNOSIS — R253 Fasciculation: Secondary | ICD-10-CM | POA: Diagnosis not present

## 2017-10-25 DIAGNOSIS — E782 Mixed hyperlipidemia: Secondary | ICD-10-CM | POA: Diagnosis not present

## 2017-10-25 DIAGNOSIS — Z6828 Body mass index (BMI) 28.0-28.9, adult: Secondary | ICD-10-CM | POA: Diagnosis not present

## 2017-10-25 DIAGNOSIS — N182 Chronic kidney disease, stage 2 (mild): Secondary | ICD-10-CM | POA: Diagnosis not present

## 2017-10-25 LAB — HEPATIC FUNCTION PANEL
AG RATIO: 2 (calc) (ref 1.0–2.5)
ALBUMIN MSPROF: 4.4 g/dL (ref 3.6–5.1)
ALT: 17 U/L (ref 9–46)
AST: 16 U/L (ref 10–35)
Alkaline phosphatase (APISO): 77 U/L (ref 40–115)
Bilirubin, Direct: 0.2 mg/dL (ref 0.0–0.2)
GLOBULIN: 2.2 g/dL (ref 1.9–3.7)
Indirect Bilirubin: 0.9 mg/dL (calc) (ref 0.2–1.2)
TOTAL PROTEIN: 6.6 g/dL (ref 6.1–8.1)
Total Bilirubin: 1.1 mg/dL (ref 0.2–1.2)

## 2017-10-26 DIAGNOSIS — N183 Chronic kidney disease, stage 3 (moderate): Secondary | ICD-10-CM | POA: Diagnosis not present

## 2017-11-10 DIAGNOSIS — S61301A Unspecified open wound of left index finger with damage to nail, initial encounter: Secondary | ICD-10-CM | POA: Diagnosis not present

## 2017-11-15 DIAGNOSIS — D229 Melanocytic nevi, unspecified: Secondary | ICD-10-CM | POA: Diagnosis not present

## 2017-11-15 DIAGNOSIS — Z23 Encounter for immunization: Secondary | ICD-10-CM | POA: Diagnosis not present

## 2017-11-15 DIAGNOSIS — L570 Actinic keratosis: Secondary | ICD-10-CM | POA: Diagnosis not present

## 2017-11-25 DIAGNOSIS — Z23 Encounter for immunization: Secondary | ICD-10-CM | POA: Diagnosis not present

## 2017-12-07 DIAGNOSIS — H10413 Chronic giant papillary conjunctivitis, bilateral: Secondary | ICD-10-CM | POA: Diagnosis not present

## 2017-12-07 DIAGNOSIS — H01025 Squamous blepharitis left lower eyelid: Secondary | ICD-10-CM | POA: Diagnosis not present

## 2017-12-07 DIAGNOSIS — H01022 Squamous blepharitis right lower eyelid: Secondary | ICD-10-CM | POA: Diagnosis not present

## 2017-12-07 DIAGNOSIS — H01024 Squamous blepharitis left upper eyelid: Secondary | ICD-10-CM | POA: Diagnosis not present

## 2017-12-07 DIAGNOSIS — H01021 Squamous blepharitis right upper eyelid: Secondary | ICD-10-CM | POA: Diagnosis not present

## 2017-12-07 DIAGNOSIS — H2513 Age-related nuclear cataract, bilateral: Secondary | ICD-10-CM | POA: Diagnosis not present

## 2018-02-03 DIAGNOSIS — J060 Acute laryngopharyngitis: Secondary | ICD-10-CM | POA: Diagnosis not present

## 2018-02-08 DIAGNOSIS — N182 Chronic kidney disease, stage 2 (mild): Secondary | ICD-10-CM | POA: Diagnosis not present

## 2018-02-08 DIAGNOSIS — E782 Mixed hyperlipidemia: Secondary | ICD-10-CM | POA: Diagnosis not present

## 2018-02-08 DIAGNOSIS — E291 Testicular hypofunction: Secondary | ICD-10-CM | POA: Diagnosis not present

## 2018-02-08 DIAGNOSIS — I10 Essential (primary) hypertension: Secondary | ICD-10-CM | POA: Diagnosis not present

## 2018-02-08 DIAGNOSIS — Z6830 Body mass index (BMI) 30.0-30.9, adult: Secondary | ICD-10-CM | POA: Diagnosis not present

## 2018-02-08 DIAGNOSIS — F5221 Male erectile disorder: Secondary | ICD-10-CM | POA: Diagnosis not present

## 2018-02-16 DIAGNOSIS — R253 Fasciculation: Secondary | ICD-10-CM | POA: Diagnosis not present

## 2018-02-16 DIAGNOSIS — E291 Testicular hypofunction: Secondary | ICD-10-CM | POA: Diagnosis not present

## 2018-02-16 DIAGNOSIS — N182 Chronic kidney disease, stage 2 (mild): Secondary | ICD-10-CM | POA: Diagnosis not present

## 2018-02-16 DIAGNOSIS — E782 Mixed hyperlipidemia: Secondary | ICD-10-CM | POA: Diagnosis not present

## 2018-03-07 DIAGNOSIS — L080 Pyoderma: Secondary | ICD-10-CM | POA: Diagnosis not present

## 2018-03-07 DIAGNOSIS — Z85828 Personal history of other malignant neoplasm of skin: Secondary | ICD-10-CM | POA: Diagnosis not present

## 2018-03-07 DIAGNOSIS — L570 Actinic keratosis: Secondary | ICD-10-CM | POA: Diagnosis not present

## 2018-03-07 DIAGNOSIS — Z808 Family history of malignant neoplasm of other organs or systems: Secondary | ICD-10-CM | POA: Diagnosis not present

## 2018-03-07 DIAGNOSIS — L821 Other seborrheic keratosis: Secondary | ICD-10-CM | POA: Diagnosis not present

## 2018-03-07 DIAGNOSIS — D225 Melanocytic nevi of trunk: Secondary | ICD-10-CM | POA: Diagnosis not present

## 2018-03-07 DIAGNOSIS — D2262 Melanocytic nevi of left upper limb, including shoulder: Secondary | ICD-10-CM | POA: Diagnosis not present

## 2018-03-07 DIAGNOSIS — D485 Neoplasm of uncertain behavior of skin: Secondary | ICD-10-CM | POA: Diagnosis not present

## 2018-03-07 DIAGNOSIS — Z86018 Personal history of other benign neoplasm: Secondary | ICD-10-CM | POA: Diagnosis not present

## 2018-04-26 DIAGNOSIS — D2261 Melanocytic nevi of right upper limb, including shoulder: Secondary | ICD-10-CM | POA: Diagnosis not present

## 2018-04-26 DIAGNOSIS — D485 Neoplasm of uncertain behavior of skin: Secondary | ICD-10-CM | POA: Diagnosis not present

## 2018-08-22 DIAGNOSIS — E782 Mixed hyperlipidemia: Secondary | ICD-10-CM | POA: Diagnosis not present

## 2018-08-22 DIAGNOSIS — Z125 Encounter for screening for malignant neoplasm of prostate: Secondary | ICD-10-CM | POA: Diagnosis not present

## 2018-08-22 DIAGNOSIS — E291 Testicular hypofunction: Secondary | ICD-10-CM | POA: Diagnosis not present

## 2018-08-22 DIAGNOSIS — N182 Chronic kidney disease, stage 2 (mild): Secondary | ICD-10-CM | POA: Diagnosis not present

## 2018-08-22 DIAGNOSIS — F5221 Male erectile disorder: Secondary | ICD-10-CM | POA: Diagnosis not present

## 2018-08-22 DIAGNOSIS — I10 Essential (primary) hypertension: Secondary | ICD-10-CM | POA: Diagnosis not present

## 2018-08-22 DIAGNOSIS — E119 Type 2 diabetes mellitus without complications: Secondary | ICD-10-CM | POA: Diagnosis not present

## 2018-08-24 DIAGNOSIS — W57XXXA Bitten or stung by nonvenomous insect and other nonvenomous arthropods, initial encounter: Secondary | ICD-10-CM | POA: Diagnosis not present

## 2018-08-24 DIAGNOSIS — F5221 Male erectile disorder: Secondary | ICD-10-CM | POA: Diagnosis not present

## 2018-08-24 DIAGNOSIS — N182 Chronic kidney disease, stage 2 (mild): Secondary | ICD-10-CM | POA: Diagnosis not present

## 2018-08-24 DIAGNOSIS — R7301 Impaired fasting glucose: Secondary | ICD-10-CM | POA: Diagnosis not present

## 2018-08-24 DIAGNOSIS — H04129 Dry eye syndrome of unspecified lacrimal gland: Secondary | ICD-10-CM | POA: Diagnosis not present

## 2018-08-24 DIAGNOSIS — R253 Fasciculation: Secondary | ICD-10-CM | POA: Diagnosis not present

## 2018-08-24 DIAGNOSIS — E782 Mixed hyperlipidemia: Secondary | ICD-10-CM | POA: Diagnosis not present

## 2018-08-24 DIAGNOSIS — E291 Testicular hypofunction: Secondary | ICD-10-CM | POA: Diagnosis not present

## 2018-11-06 DIAGNOSIS — Z23 Encounter for immunization: Secondary | ICD-10-CM | POA: Diagnosis not present

## 2018-11-08 DIAGNOSIS — N1831 Chronic kidney disease, stage 3a: Secondary | ICD-10-CM | POA: Diagnosis not present

## 2019-02-15 DIAGNOSIS — Z23 Encounter for immunization: Secondary | ICD-10-CM | POA: Diagnosis not present

## 2019-02-16 DIAGNOSIS — R7301 Impaired fasting glucose: Secondary | ICD-10-CM | POA: Diagnosis not present

## 2019-02-16 DIAGNOSIS — E782 Mixed hyperlipidemia: Secondary | ICD-10-CM | POA: Diagnosis not present

## 2019-02-16 DIAGNOSIS — I10 Essential (primary) hypertension: Secondary | ICD-10-CM | POA: Diagnosis not present

## 2019-02-21 DIAGNOSIS — E291 Testicular hypofunction: Secondary | ICD-10-CM | POA: Diagnosis not present

## 2019-02-21 DIAGNOSIS — R52 Pain, unspecified: Secondary | ICD-10-CM | POA: Diagnosis not present

## 2019-02-21 DIAGNOSIS — F5221 Male erectile disorder: Secondary | ICD-10-CM | POA: Diagnosis not present

## 2019-02-21 DIAGNOSIS — N182 Chronic kidney disease, stage 2 (mild): Secondary | ICD-10-CM | POA: Diagnosis not present

## 2019-02-21 DIAGNOSIS — R7301 Impaired fasting glucose: Secondary | ICD-10-CM | POA: Diagnosis not present

## 2019-02-21 DIAGNOSIS — K219 Gastro-esophageal reflux disease without esophagitis: Secondary | ICD-10-CM | POA: Diagnosis not present

## 2019-02-21 DIAGNOSIS — R253 Fasciculation: Secondary | ICD-10-CM | POA: Diagnosis not present

## 2019-02-21 DIAGNOSIS — E782 Mixed hyperlipidemia: Secondary | ICD-10-CM | POA: Diagnosis not present

## 2019-02-21 DIAGNOSIS — H04129 Dry eye syndrome of unspecified lacrimal gland: Secondary | ICD-10-CM | POA: Diagnosis not present

## 2019-03-14 DIAGNOSIS — Z85828 Personal history of other malignant neoplasm of skin: Secondary | ICD-10-CM | POA: Diagnosis not present

## 2019-03-14 DIAGNOSIS — D485 Neoplasm of uncertain behavior of skin: Secondary | ICD-10-CM | POA: Diagnosis not present

## 2019-03-14 DIAGNOSIS — L578 Other skin changes due to chronic exposure to nonionizing radiation: Secondary | ICD-10-CM | POA: Diagnosis not present

## 2019-03-14 DIAGNOSIS — D225 Melanocytic nevi of trunk: Secondary | ICD-10-CM | POA: Diagnosis not present

## 2019-03-14 DIAGNOSIS — L309 Dermatitis, unspecified: Secondary | ICD-10-CM | POA: Diagnosis not present

## 2019-03-14 DIAGNOSIS — Z808 Family history of malignant neoplasm of other organs or systems: Secondary | ICD-10-CM | POA: Diagnosis not present

## 2019-03-14 DIAGNOSIS — L570 Actinic keratosis: Secondary | ICD-10-CM | POA: Diagnosis not present

## 2019-03-14 DIAGNOSIS — L905 Scar conditions and fibrosis of skin: Secondary | ICD-10-CM | POA: Diagnosis not present

## 2019-03-14 DIAGNOSIS — D2262 Melanocytic nevi of left upper limb, including shoulder: Secondary | ICD-10-CM | POA: Diagnosis not present

## 2019-03-14 DIAGNOSIS — Z23 Encounter for immunization: Secondary | ICD-10-CM | POA: Diagnosis not present

## 2019-03-14 DIAGNOSIS — Z86018 Personal history of other benign neoplasm: Secondary | ICD-10-CM | POA: Diagnosis not present

## 2019-03-19 DIAGNOSIS — Z23 Encounter for immunization: Secondary | ICD-10-CM | POA: Diagnosis not present

## 2019-05-17 DIAGNOSIS — N182 Chronic kidney disease, stage 2 (mild): Secondary | ICD-10-CM | POA: Diagnosis not present

## 2019-06-13 DIAGNOSIS — H903 Sensorineural hearing loss, bilateral: Secondary | ICD-10-CM | POA: Diagnosis not present

## 2019-06-13 DIAGNOSIS — H838X3 Other specified diseases of inner ear, bilateral: Secondary | ICD-10-CM | POA: Diagnosis not present

## 2019-06-13 DIAGNOSIS — H6123 Impacted cerumen, bilateral: Secondary | ICD-10-CM | POA: Diagnosis not present

## 2019-08-15 DIAGNOSIS — E782 Mixed hyperlipidemia: Secondary | ICD-10-CM | POA: Diagnosis not present

## 2019-08-15 DIAGNOSIS — D519 Vitamin B12 deficiency anemia, unspecified: Secondary | ICD-10-CM | POA: Diagnosis not present

## 2019-08-15 DIAGNOSIS — I10 Essential (primary) hypertension: Secondary | ICD-10-CM | POA: Diagnosis not present

## 2019-08-15 DIAGNOSIS — E291 Testicular hypofunction: Secondary | ICD-10-CM | POA: Diagnosis not present

## 2019-08-15 DIAGNOSIS — E559 Vitamin D deficiency, unspecified: Secondary | ICD-10-CM | POA: Diagnosis not present

## 2019-08-15 DIAGNOSIS — R7301 Impaired fasting glucose: Secondary | ICD-10-CM | POA: Diagnosis not present

## 2019-08-15 DIAGNOSIS — F5221 Male erectile disorder: Secondary | ICD-10-CM | POA: Diagnosis not present

## 2019-08-15 DIAGNOSIS — Z1321 Encounter for screening for nutritional disorder: Secondary | ICD-10-CM | POA: Diagnosis not present

## 2019-08-15 DIAGNOSIS — Z125 Encounter for screening for malignant neoplasm of prostate: Secondary | ICD-10-CM | POA: Diagnosis not present

## 2019-08-22 DIAGNOSIS — F5221 Male erectile disorder: Secondary | ICD-10-CM | POA: Diagnosis not present

## 2019-08-22 DIAGNOSIS — N182 Chronic kidney disease, stage 2 (mild): Secondary | ICD-10-CM | POA: Diagnosis not present

## 2019-08-22 DIAGNOSIS — E291 Testicular hypofunction: Secondary | ICD-10-CM | POA: Diagnosis not present

## 2019-08-22 DIAGNOSIS — R7301 Impaired fasting glucose: Secondary | ICD-10-CM | POA: Diagnosis not present

## 2019-08-22 DIAGNOSIS — R52 Pain, unspecified: Secondary | ICD-10-CM | POA: Diagnosis not present

## 2019-08-22 DIAGNOSIS — R253 Fasciculation: Secondary | ICD-10-CM | POA: Diagnosis not present

## 2019-08-22 DIAGNOSIS — E782 Mixed hyperlipidemia: Secondary | ICD-10-CM | POA: Diagnosis not present

## 2019-08-22 DIAGNOSIS — K219 Gastro-esophageal reflux disease without esophagitis: Secondary | ICD-10-CM | POA: Diagnosis not present

## 2019-08-22 DIAGNOSIS — H04129 Dry eye syndrome of unspecified lacrimal gland: Secondary | ICD-10-CM | POA: Diagnosis not present

## 2019-09-12 DIAGNOSIS — L570 Actinic keratosis: Secondary | ICD-10-CM | POA: Diagnosis not present

## 2019-09-17 DIAGNOSIS — H0102B Squamous blepharitis left eye, upper and lower eyelids: Secondary | ICD-10-CM | POA: Diagnosis not present

## 2019-09-17 DIAGNOSIS — H1045 Other chronic allergic conjunctivitis: Secondary | ICD-10-CM | POA: Diagnosis not present

## 2019-09-17 DIAGNOSIS — H2513 Age-related nuclear cataract, bilateral: Secondary | ICD-10-CM | POA: Diagnosis not present

## 2019-09-17 DIAGNOSIS — H0102A Squamous blepharitis right eye, upper and lower eyelids: Secondary | ICD-10-CM | POA: Diagnosis not present

## 2019-10-10 DIAGNOSIS — H0102B Squamous blepharitis left eye, upper and lower eyelids: Secondary | ICD-10-CM | POA: Diagnosis not present

## 2019-10-10 DIAGNOSIS — H2513 Age-related nuclear cataract, bilateral: Secondary | ICD-10-CM | POA: Diagnosis not present

## 2019-10-10 DIAGNOSIS — H1045 Other chronic allergic conjunctivitis: Secondary | ICD-10-CM | POA: Diagnosis not present

## 2019-10-10 DIAGNOSIS — H0102A Squamous blepharitis right eye, upper and lower eyelids: Secondary | ICD-10-CM | POA: Diagnosis not present

## 2019-11-08 DIAGNOSIS — H2511 Age-related nuclear cataract, right eye: Secondary | ICD-10-CM | POA: Diagnosis not present

## 2019-11-12 DIAGNOSIS — Z23 Encounter for immunization: Secondary | ICD-10-CM | POA: Diagnosis not present

## 2019-12-09 DIAGNOSIS — H2512 Age-related nuclear cataract, left eye: Secondary | ICD-10-CM | POA: Diagnosis not present

## 2019-12-13 DIAGNOSIS — H2512 Age-related nuclear cataract, left eye: Secondary | ICD-10-CM | POA: Diagnosis not present

## 2020-01-10 DIAGNOSIS — Z23 Encounter for immunization: Secondary | ICD-10-CM | POA: Diagnosis not present

## 2020-02-06 DIAGNOSIS — L578 Other skin changes due to chronic exposure to nonionizing radiation: Secondary | ICD-10-CM | POA: Diagnosis not present

## 2020-02-06 DIAGNOSIS — L821 Other seborrheic keratosis: Secondary | ICD-10-CM | POA: Diagnosis not present

## 2020-02-06 DIAGNOSIS — D225 Melanocytic nevi of trunk: Secondary | ICD-10-CM | POA: Diagnosis not present

## 2020-02-06 DIAGNOSIS — L570 Actinic keratosis: Secondary | ICD-10-CM | POA: Diagnosis not present

## 2020-02-06 DIAGNOSIS — Z85828 Personal history of other malignant neoplasm of skin: Secondary | ICD-10-CM | POA: Diagnosis not present

## 2020-02-06 DIAGNOSIS — L820 Inflamed seborrheic keratosis: Secondary | ICD-10-CM | POA: Diagnosis not present

## 2020-02-06 DIAGNOSIS — L308 Other specified dermatitis: Secondary | ICD-10-CM | POA: Diagnosis not present

## 2020-02-06 DIAGNOSIS — L814 Other melanin hyperpigmentation: Secondary | ICD-10-CM | POA: Diagnosis not present

## 2020-02-06 DIAGNOSIS — D2272 Melanocytic nevi of left lower limb, including hip: Secondary | ICD-10-CM | POA: Diagnosis not present

## 2020-02-06 DIAGNOSIS — Z808 Family history of malignant neoplasm of other organs or systems: Secondary | ICD-10-CM | POA: Diagnosis not present

## 2020-02-06 DIAGNOSIS — Z86018 Personal history of other benign neoplasm: Secondary | ICD-10-CM | POA: Diagnosis not present

## 2020-02-14 DIAGNOSIS — I10 Essential (primary) hypertension: Secondary | ICD-10-CM | POA: Diagnosis not present

## 2020-02-14 DIAGNOSIS — R7301 Impaired fasting glucose: Secondary | ICD-10-CM | POA: Diagnosis not present

## 2020-02-14 DIAGNOSIS — Z6830 Body mass index (BMI) 30.0-30.9, adult: Secondary | ICD-10-CM | POA: Diagnosis not present

## 2020-02-14 DIAGNOSIS — W57XXXA Bitten or stung by nonvenomous insect and other nonvenomous arthropods, initial encounter: Secondary | ICD-10-CM | POA: Diagnosis not present

## 2020-02-14 DIAGNOSIS — R040 Epistaxis: Secondary | ICD-10-CM | POA: Diagnosis not present

## 2020-02-14 DIAGNOSIS — F419 Anxiety disorder, unspecified: Secondary | ICD-10-CM | POA: Diagnosis not present

## 2020-02-14 DIAGNOSIS — R52 Pain, unspecified: Secondary | ICD-10-CM | POA: Diagnosis not present

## 2020-02-14 DIAGNOSIS — J09X2 Influenza due to identified novel influenza A virus with other respiratory manifestations: Secondary | ICD-10-CM | POA: Diagnosis not present

## 2020-02-14 DIAGNOSIS — K219 Gastro-esophageal reflux disease without esophagitis: Secondary | ICD-10-CM | POA: Diagnosis not present

## 2020-02-14 DIAGNOSIS — E291 Testicular hypofunction: Secondary | ICD-10-CM | POA: Diagnosis not present

## 2020-02-21 DIAGNOSIS — H04129 Dry eye syndrome of unspecified lacrimal gland: Secondary | ICD-10-CM | POA: Diagnosis not present

## 2020-02-21 DIAGNOSIS — R52 Pain, unspecified: Secondary | ICD-10-CM | POA: Diagnosis not present

## 2020-02-21 DIAGNOSIS — K219 Gastro-esophageal reflux disease without esophagitis: Secondary | ICD-10-CM | POA: Diagnosis not present

## 2020-02-21 DIAGNOSIS — E782 Mixed hyperlipidemia: Secondary | ICD-10-CM | POA: Diagnosis not present

## 2020-02-21 DIAGNOSIS — R7301 Impaired fasting glucose: Secondary | ICD-10-CM | POA: Diagnosis not present

## 2020-02-21 DIAGNOSIS — R253 Fasciculation: Secondary | ICD-10-CM | POA: Diagnosis not present

## 2020-02-21 DIAGNOSIS — N182 Chronic kidney disease, stage 2 (mild): Secondary | ICD-10-CM | POA: Diagnosis not present

## 2020-02-21 DIAGNOSIS — E291 Testicular hypofunction: Secondary | ICD-10-CM | POA: Diagnosis not present

## 2020-02-21 DIAGNOSIS — F5221 Male erectile disorder: Secondary | ICD-10-CM | POA: Diagnosis not present

## 2020-07-09 DIAGNOSIS — Z961 Presence of intraocular lens: Secondary | ICD-10-CM | POA: Diagnosis not present

## 2020-07-09 DIAGNOSIS — H1045 Other chronic allergic conjunctivitis: Secondary | ICD-10-CM | POA: Diagnosis not present

## 2020-07-09 DIAGNOSIS — H0102A Squamous blepharitis right eye, upper and lower eyelids: Secondary | ICD-10-CM | POA: Diagnosis not present

## 2020-07-09 DIAGNOSIS — H04123 Dry eye syndrome of bilateral lacrimal glands: Secondary | ICD-10-CM | POA: Diagnosis not present

## 2020-07-09 DIAGNOSIS — H0102B Squamous blepharitis left eye, upper and lower eyelids: Secondary | ICD-10-CM | POA: Diagnosis not present

## 2020-08-11 DIAGNOSIS — H6123 Impacted cerumen, bilateral: Secondary | ICD-10-CM | POA: Diagnosis not present

## 2020-08-14 DIAGNOSIS — E291 Testicular hypofunction: Secondary | ICD-10-CM | POA: Diagnosis not present

## 2020-08-14 DIAGNOSIS — E782 Mixed hyperlipidemia: Secondary | ICD-10-CM | POA: Diagnosis not present

## 2020-08-14 DIAGNOSIS — R7301 Impaired fasting glucose: Secondary | ICD-10-CM | POA: Diagnosis not present

## 2020-08-14 DIAGNOSIS — E559 Vitamin D deficiency, unspecified: Secondary | ICD-10-CM | POA: Diagnosis not present

## 2020-08-19 ENCOUNTER — Encounter (INDEPENDENT_AMBULATORY_CARE_PROVIDER_SITE_OTHER): Payer: Self-pay | Admitting: *Deleted

## 2020-08-25 DIAGNOSIS — E559 Vitamin D deficiency, unspecified: Secondary | ICD-10-CM | POA: Diagnosis not present

## 2020-08-25 DIAGNOSIS — H04123 Dry eye syndrome of bilateral lacrimal glands: Secondary | ICD-10-CM | POA: Diagnosis not present

## 2020-08-25 DIAGNOSIS — E291 Testicular hypofunction: Secondary | ICD-10-CM | POA: Diagnosis not present

## 2020-08-25 DIAGNOSIS — K219 Gastro-esophageal reflux disease without esophagitis: Secondary | ICD-10-CM | POA: Diagnosis not present

## 2020-08-25 DIAGNOSIS — R253 Fasciculation: Secondary | ICD-10-CM | POA: Diagnosis not present

## 2020-08-25 DIAGNOSIS — F5221 Male erectile disorder: Secondary | ICD-10-CM | POA: Diagnosis not present

## 2020-08-25 DIAGNOSIS — R17 Unspecified jaundice: Secondary | ICD-10-CM | POA: Diagnosis not present

## 2020-08-25 DIAGNOSIS — N182 Chronic kidney disease, stage 2 (mild): Secondary | ICD-10-CM | POA: Diagnosis not present

## 2020-08-25 DIAGNOSIS — E782 Mixed hyperlipidemia: Secondary | ICD-10-CM | POA: Diagnosis not present

## 2020-08-25 DIAGNOSIS — R7301 Impaired fasting glucose: Secondary | ICD-10-CM | POA: Diagnosis not present

## 2020-08-25 DIAGNOSIS — M6281 Muscle weakness (generalized): Secondary | ICD-10-CM | POA: Diagnosis not present

## 2020-08-25 DIAGNOSIS — Z0001 Encounter for general adult medical examination with abnormal findings: Secondary | ICD-10-CM | POA: Diagnosis not present

## 2020-09-04 DIAGNOSIS — Z23 Encounter for immunization: Secondary | ICD-10-CM | POA: Diagnosis not present

## 2020-10-20 ENCOUNTER — Telehealth (INDEPENDENT_AMBULATORY_CARE_PROVIDER_SITE_OTHER): Payer: Self-pay

## 2020-10-20 ENCOUNTER — Other Ambulatory Visit (INDEPENDENT_AMBULATORY_CARE_PROVIDER_SITE_OTHER): Payer: Self-pay

## 2020-10-20 ENCOUNTER — Encounter (INDEPENDENT_AMBULATORY_CARE_PROVIDER_SITE_OTHER): Payer: Self-pay

## 2020-10-20 DIAGNOSIS — Z86010 Personal history of colonic polyps: Secondary | ICD-10-CM

## 2020-10-20 MED ORDER — PEG 3350-KCL-NA BICARB-NACL 420 G PO SOLR: 4000.0000 mL | ORAL | 0 refills | Status: DC

## 2020-10-20 NOTE — Telephone Encounter (Signed)
LeighAnn Paulanthony Gleaves, CMA  

## 2020-10-20 NOTE — Telephone Encounter (Signed)
Referring MD/PCP: [place] Crane   Procedure: Tcs  Reason/Indication:  History of Polyps  Has patient had this procedure before?  yes  If so, when, by whom and where?  07/2013  Is there a family history of colon cancer?  no  Who?  What age when diagnosed?    Is patient diabetic? If yes, Type 1 or Type 2   no      Does patient have prosthetic heart valve or mechanical valve?  no  Do you have a pacemaker/defibrillator?  no  Has patient ever had endocarditis/atrial fibrillation? no  Does patient use oxygen? no  Has patient had joint replacement within last 12 months?  no  Is patient constipated or do they take laxatives? yes  Does patient have a history of alcohol/drug use?  no  Have you had a stroke/heart attack last 6 mths? no  Do you take medicine for weight loss?  no  For male patients,: do you still have your menstrual cycle? N/A  Is patient on blood thinner such as Coumadin, Plavix and/or Aspirin? yes  Medications: asa 81 mg daily, pepcid 40 mg daily, lipitor 10 mg daily, miralax 1/2-1 cap daily, citrcel daily, MVI daily, Vit D daily  Allergies: nkda  Medication Adjustment per Dr Laural Golden  HOLD ASPIRIN 2 Crocker  Procedure date & time: 11/06/20 10:30 AM

## 2020-11-06 ENCOUNTER — Encounter (HOSPITAL_COMMUNITY): Payer: Self-pay | Admitting: Internal Medicine

## 2020-11-06 ENCOUNTER — Encounter (HOSPITAL_COMMUNITY)
Admission: RE | Disposition: A | Discharge status: Home / Self Care | Payer: Self-pay | Source: Ambulatory Visit | Attending: Internal Medicine

## 2020-11-06 ENCOUNTER — Ambulatory Visit (HOSPITAL_COMMUNITY)
Admission: RE | Admit: 2020-11-06 | Discharge: 2020-11-06 | Disposition: A | Discharge status: Home / Self Care | Payer: Medicare Other | Source: Ambulatory Visit | Attending: Internal Medicine | Admitting: Internal Medicine

## 2020-11-06 ENCOUNTER — Other Ambulatory Visit: Payer: Self-pay

## 2020-11-06 DIAGNOSIS — K5900 Constipation, unspecified: Secondary | ICD-10-CM | POA: Insufficient documentation

## 2020-11-06 DIAGNOSIS — Z87891 Personal history of nicotine dependence: Secondary | ICD-10-CM | POA: Diagnosis not present

## 2020-11-06 DIAGNOSIS — Z86010 Personal history of colonic polyps: Secondary | ICD-10-CM | POA: Diagnosis not present

## 2020-11-06 DIAGNOSIS — D123 Benign neoplasm of transverse colon: Secondary | ICD-10-CM | POA: Insufficient documentation

## 2020-11-06 DIAGNOSIS — E785 Hyperlipidemia, unspecified: Secondary | ICD-10-CM | POA: Insufficient documentation

## 2020-11-06 DIAGNOSIS — K644 Residual hemorrhoidal skin tags: Secondary | ICD-10-CM | POA: Insufficient documentation

## 2020-11-06 DIAGNOSIS — Z1211 Encounter for screening for malignant neoplasm of colon: Secondary | ICD-10-CM | POA: Diagnosis not present

## 2020-11-06 DIAGNOSIS — Z7982 Long term (current) use of aspirin: Secondary | ICD-10-CM | POA: Diagnosis not present

## 2020-11-06 DIAGNOSIS — Z79899 Other long term (current) drug therapy: Secondary | ICD-10-CM | POA: Insufficient documentation

## 2020-11-06 DIAGNOSIS — Z791 Long term (current) use of non-steroidal anti-inflammatories (NSAID): Secondary | ICD-10-CM | POA: Insufficient documentation

## 2020-11-06 DIAGNOSIS — Z91030 Bee allergy status: Secondary | ICD-10-CM | POA: Diagnosis not present

## 2020-11-06 DIAGNOSIS — K219 Gastro-esophageal reflux disease without esophagitis: Secondary | ICD-10-CM | POA: Diagnosis not present

## 2020-11-06 DIAGNOSIS — Z09 Encounter for follow-up examination after completed treatment for conditions other than malignant neoplasm: Secondary | ICD-10-CM | POA: Diagnosis not present

## 2020-11-06 DIAGNOSIS — K648 Other hemorrhoids: Secondary | ICD-10-CM | POA: Insufficient documentation

## 2020-11-06 HISTORY — PX: POLYPECTOMY: SHX5525

## 2020-11-06 HISTORY — PX: COLONOSCOPY: SHX5424

## 2020-11-06 SURGERY — COLONOSCOPY
Anesthesia: Moderate Sedation

## 2020-11-06 MED ORDER — SODIUM CHLORIDE 0.9 % IV SOLN: INTRAVENOUS | Status: DC

## 2020-11-06 MED ORDER — MEPERIDINE HCL 50 MG/ML IJ SOLN
INTRAMUSCULAR | Status: AC
  Filled 2020-11-06: qty 1

## 2020-11-06 MED ORDER — MIDAZOLAM HCL 5 MG/5ML IJ SOLN
INTRAMUSCULAR | Status: DC | PRN
  Administered 2020-11-06: 2 mg via INTRAVENOUS
  Administered 2020-11-06 (×2): 1 mg via INTRAVENOUS

## 2020-11-06 MED ORDER — MIDAZOLAM HCL 5 MG/5ML IJ SOLN
INTRAMUSCULAR | Status: AC
  Filled 2020-11-06: qty 10

## 2020-11-06 MED ORDER — MEPERIDINE HCL 50 MG/ML IJ SOLN
INTRAMUSCULAR | Status: DC | PRN
  Administered 2020-11-06 (×2): 25 mg via INTRAVENOUS

## 2020-11-06 MED ORDER — STERILE WATER FOR IRRIGATION IR SOLN
Status: DC | PRN
  Administered 2020-11-06: 1.5 mL

## 2020-11-06 NOTE — Op Note (Signed)
[facility] Patient Name: Andrew Mcgrath Procedure Date: 11/06/2020 10:08 AM MRN: [id number] Date of Birth: [birth year] Attending MD: Hildred Laser , MD CSN: [id number] Age: 80 Admit Type: Outpatient Procedure:                Colonoscopy Indications:              High risk colon cancer surveillance: Personal                            history of colonic polyps Providers:                Hildred Laser, MD, Charlsie Quest. Insurance claims handler, Therapist, sports,                            Suzan Garibaldi. Risa Grill, Technician Referring MD:             Delphina Cahill, MD Medicines:                Meperidine 50 mg IV, Midazolam 4 mg IV Complications:            No immediate complications. Estimated Blood Loss:     Estimated blood loss was minimal. Procedure:                Pre-Anesthesia Assessment:                           - Prior to the procedure, a History and Physical                            was performed, and patient medications and                            allergies were reviewed. The patient's tolerance of                            previous anesthesia was also reviewed. The risks                            and benefits of the procedure and the sedation                            options and risks were discussed with the patient.                            All questions were answered, and informed consent                            was obtained. Prior Anticoagulants: The patient has                            taken no previous anticoagulant or antiplatelet                            agents except for aspirin. ASA Grade Assessment: II                            -  A patient with mild systemic disease. After                            reviewing the risks and benefits, the patient was                            deemed in satisfactory condition to undergo the                            procedure.                           After obtaining informed consent, the colonoscope                            was passed  under direct vision. Throughout the                            procedure, the patient's blood pressure, pulse, and                            oxygen saturations were monitored continuously. The                            [id number]-[id number] ([id number]) scope was introduced through                            the anus and advanced to the the cecum, identified                            by appendiceal orifice and ileocecal valve. The                            colonoscopy was performed without difficulty. The                            patient tolerated the procedure well. The quality                            of the bowel preparation was good. The ileocecal                            valve, appendiceal orifice, and rectum were                            photographed. Scope In: 10:31:08 AM Scope Out: 11:07:12 AM Scope Withdrawal Time: 0 hours 25 minutes 52 seconds  Total Procedure Duration: 0 hours 36 minutes 4 seconds  Findings:      The perianal and digital rectal examinations were normal.      A small polyp was found in the hepatic flexure. Biopsies were taken with       a cold forceps for histology. The pathology specimen was placed into       Bottle Number 1.      Two polyps were found in  the transverse colon. The polyps were 4 to 6 mm       in size. These polyps were removed with a cold snare. Resection and       retrieval were complete. The pathology specimen was placed into Bottle       Number 1.      A 10 mm polyp was found in the splenic flexure. The polyp was       semi-pedunculated. The polyp was removed with a cold snare. Resection       and retrieval were complete. To stop active bleeding, two hemostatic       clips were successfully placed (MR conditional). There was no bleeding       at the end of the procedure. The pathology specimen was placed into       Bottle Number 2.      External and internal hemorrhoids were found during retroflexion. The       hemorrhoids were small.       The terminal ileum appeared normal. Impression:               - Normal terminal ileum.                           - One small polyp at the hepatic flexure. Biopsied.                           - Two 4 to 6 mm polyps in the transverse colon,                            removed with a cold snare. Resected and retrieved.                           - One 10 mm polyp at the splenic flexure, removed                            with a cold snare. Resected and retrieved. Clips                            (MR conditional) were placed.                           - External and internal hemorrhoids. Moderate Sedation:      Moderate (conscious) sedation was administered by the endoscopy nurse       and supervised by the endoscopist. The following parameters were       monitored: oxygen saturation, heart rate, blood pressure, CO2       capnography and response to care. Total physician intraservice time was       31 minutes. Recommendation:           - Patient has a contact number available for                            emergencies. The signs and symptoms of potential                            delayed complications were discussed with the  patient. Return to normal activities tomorrow.                            Written discharge instructions were provided to the                            patient.                           - Resume previous diet today.                           - Continue present medications.                           - No aspirin, ibuprofen, naproxen, or other                            non-steroidal anti-inflammatory drugs for 3 days.                           - Await pathology results.                           - Repeat colonoscopy is recommended. The                            colonoscopy date will be determined after pathology                            results from today's exam become available for                            review. Procedure Code(s):         --- Professional ---                           [phone], Colonoscopy, flexible; with removal of                            tumor(s), polyp(s), or other lesion(s) by snare                            technique                           45380, 59, Colonoscopy, flexible; with biopsy,                            single or multiple                           99153, Moderate sedation; each additional 15                            minutes intraservice time  G0500, Moderate sedation services provided by the                            same physician or other qualified health care                            professional performing a gastrointestinal                            endoscopic service that sedation supports,                            requiring the presence of an independent trained                            observer to assist in the monitoring of the                            patient's level of consciousness and physiological                            status; initial 15 minutes of intra-service time;                            patient age 37 years or older (additional time may                            be reported with [id number], as appropriate) Diagnosis Code(s):        --- Professional ---                           K63.5, Polyp of colon                           Z86.010, Personal history of colonic polyps                           K64.8, Other hemorrhoids CPT copyright 2019 American [employer]. All rights reserved. The codes documented in this report are preliminary and upon coder review may  be revised to meet current compliance requirements. Hildred Laser, MD Hildred Laser, MD 11/06/2020 11:19:07 AM This report has been signed electronically. Number of Addenda: 0

## 2020-11-06 NOTE — H&P (Signed)
Andrew Mcgrath is an 80 y.o. male.   Chief Complaint: Patient is here for colonoscopy. HPI: Patient is 80 year old Caucasian male who has a history of colonic adenomas and is here for surveillance colonoscopy.  His last colonoscopy was in July 2015 with removal of 2 tubular adenomas.  He denies abdominal pain or rectal bleeding.  He has constipation and he is trying to control it by taking Citrucel and MiraLAX but he does not take either daily. His appetite is good and his weight is stable.  Remains very active. Family history is negative for colorectal cancer.  Past Medical History:  Diagnosis Date   Familial restless leg syndrome    GERD (gastroesophageal reflux disease)    Hyperlipidemia     Past Surgical History:  Procedure Laterality Date   APPENDECTOMY     CARDIAC CATHETERIZATION     COLONOSCOPY     COLONOSCOPY N/A 08/23/2013   Procedure: COLONOSCOPY;  Surgeon: Rogene Houston, MD;  [facility];  Service: Endoscopy;  Laterality: N/A;  1200   TONSILLECTOMY      Family History  Problem Relation Age of Onset   Colon cancer Neg Hx    Social History:  reports that he has quit smoking. His smoking use included cigarettes. He has a 30.00 pack-year smoking history. He has never used smokeless tobacco. He reports current alcohol use. He reports that he does not use drugs.  Allergies:  Allergies  Allergen Reactions   Bee Venom Swelling    Medications Prior to Admission  Medication Sig Dispense Refill   aspirin EC 81 MG tablet Take 81 mg by mouth daily.     atorvastatin (LIPITOR) 10 MG tablet Take 10 mg by mouth daily.     famotidine (PEPCID) 40 MG tablet Take 40 mg by mouth daily.     Multiple Vitamins-Minerals (MULTIVITAMIN WITH MINERALS) tablet Take 1 tablet by mouth daily.     Polyethyl Glycol-Propyl Glycol 0.4-0.3 % SOLN Place 1-2 drops into both eyes daily as needed (dry eyes).     testosterone cypionate (DEPOTESTOSTERONE CYPIONATE) 200 MG/ML injection Inject  200 mg into the muscle every 21 ( twenty-one) days.     cetirizine (ZYRTEC) 10 MG tablet Take 10 mg by mouth daily as needed for allergies.     diclofenac Sodium (VOLTAREN) 1 % GEL Apply 1 application topically daily as needed (pain).     EPINEPHrine 0.3 mg/0.3 mL IJ SOAJ injection Inject 0.3 mg into the muscle as needed for anaphylaxis (Bee stings).     fluticasone (FLONASE) 50 MCG/ACT nasal spray Place 1 spray into both nostrils daily as needed for allergies or rhinitis.     polyethylene glycol-electrolytes (TRILYTE) 420 g solution Take 4,000 mLs by mouth as directed. 4000 mL 0    No results found for this or any previous visit (from the past 48 hour(s)). No results found.  Review of Systems  Blood pressure (!) 143/80, pulse 64, temperature 98.6 F (37 C), temperature source Oral, resp. rate (!) 22, height 6\' 1"  (1.854 m), weight 95.3 kg, SpO2 95 %. Physical Exam HENT:     Mouth/Throat:     Mouth: Mucous membranes are moist.     Pharynx: Oropharynx is clear.  Eyes:     General: No scleral icterus.    Conjunctiva/sclera: Conjunctivae normal.  Cardiovascular:     Rate and Rhythm: Normal rate and regular rhythm.     Heart sounds: Normal heart sounds. No murmur heard. Pulmonary:  Effort: Pulmonary effort is normal.     Breath sounds: Normal breath sounds.  Abdominal:     General: There is no distension.     Palpations: Abdomen is soft. There is no mass.     Tenderness: There is no abdominal tenderness.     Comments: Appendectomy scar  Neurological:     Mental Status: He is alert.     Assessment/Plan  History of colonic adenomas Surveillance colonoscopy  Hildred Laser, MD 11/06/2020, 10:17 AM

## 2020-11-06 NOTE — Discharge Instructions (Addendum)
Resume aspirin on 11/09/2020 Resume other medications and diet as before. No driving for 24 hours. Physician will call with biopsy results. Remember you cannot have an MRI until clips have passed.

## 2020-11-09 LAB — SURGICAL PATHOLOGY

## 2020-11-10 ENCOUNTER — Encounter (HOSPITAL_COMMUNITY): Payer: Self-pay | Admitting: Internal Medicine

## 2020-11-20 ENCOUNTER — Other Ambulatory Visit (HOSPITAL_COMMUNITY): Payer: Self-pay | Admitting: Family Medicine

## 2020-11-20 ENCOUNTER — Other Ambulatory Visit (HOSPITAL_BASED_OUTPATIENT_CLINIC_OR_DEPARTMENT_OTHER): Payer: Self-pay | Admitting: Family Medicine

## 2020-11-20 DIAGNOSIS — R413 Other amnesia: Secondary | ICD-10-CM | POA: Diagnosis not present

## 2020-11-20 DIAGNOSIS — Z23 Encounter for immunization: Secondary | ICD-10-CM | POA: Diagnosis not present

## 2020-11-20 DIAGNOSIS — R519 Headache, unspecified: Secondary | ICD-10-CM

## 2020-11-20 DIAGNOSIS — R42 Dizziness and giddiness: Secondary | ICD-10-CM | POA: Diagnosis not present

## 2020-11-20 DIAGNOSIS — I10 Essential (primary) hypertension: Secondary | ICD-10-CM | POA: Diagnosis not present

## 2020-11-28 ENCOUNTER — Other Ambulatory Visit: Payer: Self-pay

## 2020-11-28 ENCOUNTER — Ambulatory Visit (HOSPITAL_COMMUNITY)
Admission: RE | Admit: 2020-11-28 | Discharge: 2020-11-28 | Disposition: A | Discharge status: Home / Self Care | Payer: Medicare Other | Source: Ambulatory Visit | Attending: Family Medicine | Admitting: Family Medicine

## 2020-11-28 DIAGNOSIS — R410 Disorientation, unspecified: Secondary | ICD-10-CM | POA: Diagnosis not present

## 2020-11-28 DIAGNOSIS — R42 Dizziness and giddiness: Secondary | ICD-10-CM | POA: Diagnosis not present

## 2020-11-28 DIAGNOSIS — R519 Headache, unspecified: Secondary | ICD-10-CM | POA: Insufficient documentation

## 2020-12-04 ENCOUNTER — Ambulatory Visit (INDEPENDENT_AMBULATORY_CARE_PROVIDER_SITE_OTHER): Payer: Medicare Other

## 2020-12-04 ENCOUNTER — Encounter: Payer: Self-pay | Admitting: Cardiology

## 2020-12-04 ENCOUNTER — Ambulatory Visit (INDEPENDENT_AMBULATORY_CARE_PROVIDER_SITE_OTHER): Payer: Medicare Other | Admitting: Cardiology

## 2020-12-04 VITALS — BP 140/86 | HR 67 | Ht 73.0 in | Wt 214.2 lb

## 2020-12-04 DIAGNOSIS — R55 Syncope and collapse: Secondary | ICD-10-CM | POA: Diagnosis not present

## 2020-12-04 DIAGNOSIS — G44229 Chronic tension-type headache, not intractable: Secondary | ICD-10-CM | POA: Diagnosis not present

## 2020-12-04 DIAGNOSIS — E7800 Pure hypercholesterolemia, unspecified: Secondary | ICD-10-CM | POA: Diagnosis not present

## 2020-12-04 DIAGNOSIS — R079 Chest pain, unspecified: Secondary | ICD-10-CM | POA: Diagnosis not present

## 2020-12-04 DIAGNOSIS — R9431 Abnormal electrocardiogram [ECG] [EKG]: Secondary | ICD-10-CM

## 2020-12-04 DIAGNOSIS — R519 Headache, unspecified: Secondary | ICD-10-CM | POA: Diagnosis not present

## 2020-12-04 DIAGNOSIS — I10 Essential (primary) hypertension: Secondary | ICD-10-CM | POA: Diagnosis not present

## 2020-12-04 DIAGNOSIS — I447 Left bundle-branch block, unspecified: Secondary | ICD-10-CM | POA: Insufficient documentation

## 2020-12-04 DIAGNOSIS — G8929 Other chronic pain: Secondary | ICD-10-CM | POA: Insufficient documentation

## 2020-12-04 NOTE — Assessment & Plan Note (Signed)
I will check an echocardiogram to ensure proper structure and function of his heart.  I will also check a Zio patch monitor to ensure that he does not have any worsening conduction disease/high degree AV block.  One of his brothers has a pacemaker one of his brothers has ICD.

## 2020-12-04 NOTE — Patient Instructions (Signed)
Medication Instructions:  The current medical regimen is effective;  continue present plan and medications.  *If you need a refill on your cardiac medications before your next appointment, please call your pharmacy*  Testing/Procedures: Your physician has requested that you have an echocardiogram. Echocardiography is a painless test that uses sound waves to create images of your heart. It provides your doctor with information about the size and shape of your heart and how well your heart's chambers and valves are working. This procedure takes approximately one hour. There are no restrictions for this procedure.  Your physician has requested that you have a carotid duplex. This test is an ultrasound of the carotid arteries in your neck. It looks at blood flow through these arteries that supply the brain with blood. Allow one hour for this exam. There are no restrictions or special instructions.  ZIO XT- Long Term Monitor Instructions  Your physician has requested you wear a ZIO patch monitor for 14 days.  This is a single patch monitor. [employer] supplies one patch monitor per enrollment. Additional stickers are not available. Please do not apply patch if you will be having a Nuclear Stress Test,  Echocardiogram, Cardiac CT, MRI, or Chest Xray during the period you would be wearing the  monitor. The patch cannot be worn during these tests. You cannot remove and re-apply the  ZIO XT patch monitor.  Your ZIO patch monitor will be mailed 3 day USPS to your address on file. It may take 3-5 days  to receive your monitor after you have been enrolled.  Once you have received your monitor, please review the enclosed instructions. Your monitor  has already been registered assigning a specific monitor serial # to you.  Billing and Patient Assistance Program Information  We have supplied [employer] with any of your insurance information on file for billing purposes. [employer] offers a sliding scale Patient  Assistance Program for patients that do not have  insurance, or whose insurance does not completely cover the cost of the ZIO monitor.  You must apply for the Patient Assistance Program to qualify for this discounted rate.  To apply, please call [employer] at [phone], select option 4, select option 2, ask to apply for  Patient Assistance Program. Theodore Demark will ask your household income, and how many people  are in your household. They will quote your out-of-pocket cost based on that information.  [employer] will also be able to set up a 60-month, interest-free payment plan if needed.  Applying the monitor   Shave hair from upper left chest.  Hold abrader disc by orange tab. Rub abrader in 40 strokes over the upper left chest as  indicated in your monitor instructions.  Clean area with 4 enclosed alcohol pads. Let dry.  Apply patch as indicated in monitor instructions. Patch will be placed under collarbone on left  side of chest with arrow pointing upward.  Rub patch adhesive wings for 2 minutes. Remove white label marked "1". Remove the white  label marked "2". Rub patch adhesive wings for 2 additional minutes.  While looking in a mirror, press and release button in center of patch. A small green light will  flash 3-4 times. This will be your only indicator that the monitor has been turned on.  Do not shower for the first 24 hours. You may shower after the first 24 hours.  Press the button if you feel a symptom. You will hear a small click. Record Date, Time and  Symptom in the  Patient Logbook.  When you are ready to remove the patch, follow instructions on the last 2 pages of Patient  Logbook. Stick patch monitor onto the last page of Patient Logbook.  Place Patient Logbook in the blue and white box. Use locking tab on box and tape box closed  securely. The blue and white box has prepaid postage on it. Please place it in the mailbox as  soon as possible. Your physician should have your test  results approximately 7 days after the  monitor has been mailed back to [facility].  Call [place] at [phone] if you have questions regarding  your ZIO XT patch monitor. Call them immediately if you see an orange light blinking on your  monitor.  If your monitor falls off in less than 4 days, contact our Monitor department at [phone].  If your monitor becomes loose or falls off after 4 days call [employer] at [phone] for  suggestions on securing your monitor   Follow-Up: At [facility], you and your health needs are our priority.  As part of our continuing mission to provide you with exceptional heart care, we have created designated Provider Care Teams.  These Care Teams include your primary Cardiologist (physician) and Advanced Practice Providers (APPs -  Physician Assistants and Nurse Practitioners) who all work together to provide you with the care you need, when you need it.  We recommend signing up for the patient portal called "MyChart".  Sign up information is provided on this After Visit Summary.  MyChart is used to connect with patients for Virtual Visits (Telemedicine).  Patients are able to view lab/test results, encounter notes, upcoming appointments, etc.  Non-urgent messages can be sent to your provider as well.   To learn more about what you can do with MyChart, go to [email or web address].    Your next appointment:   1 year(s)  The format for your next appointment:   In Person  Provider:   Dr Candee Furbish {   Thank you for choosing [place]!!

## 2020-12-04 NOTE — Assessment & Plan Note (Signed)
We will go ahead and check carotid Dopplers.  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

## 2020-12-04 NOTE — Progress Notes (Signed)
Cardiology Office Note:    Date:  12/04/2020   ID:  MAGGIE DWORKIN, DOB 05/28/1940, MRN [id number]  PCP:  Celene Squibb, MD   [facility] Providers Cardiologist:  None     Referring MD: Celene Squibb, MD    History of Present Illness:    Andrew Mcgrath is a 80 y.o. male here for the evaluation of left bundle branch block at the request of Dr. Nevada Crane.  In review of office notes patient has been feeling weak, shaky.  Takes testosterone supplement, atorvastatin 10 famotidine 40.  Sometimes he feels his heart rate in the 50s and feels sort of weak especially after working in the yard.  Blood sugar has been fine.  Blood pressure okay.  2 of his brothers have pacemakers / ICD.  He wants to get checked for a possible monitor.  Head ache. Felt dizzy after CT. Confused.  Head CT showed an abnormality of chronic small vessel disease.  Wife is requesting carotid Dopplers.  He has had muscle fasciculations for quite some time.  At one point was told that he may have ALS.  Clearly he does not have that.  He has had 3 separate EMGs done.  He is not interested in another.  Back in the year 2000 he had a heart catheterization at [facility] outpatient lab.   Past Medical History:  Diagnosis Date   Bradycardia    Familial restless leg syndrome    GERD (gastroesophageal reflux disease)    Hyperlipidemia     Past Surgical History:  Procedure Laterality Date   APPENDECTOMY     CARDIAC CATHETERIZATION     COLONOSCOPY     COLONOSCOPY N/A 08/23/2013   Procedure: COLONOSCOPY;  Surgeon: Rogene Houston, MD;  Location[facility];  Service: Endoscopy;  Laterality: N/A;  1200   COLONOSCOPY N/A 11/06/2020   Procedure: COLONOSCOPY;  Surgeon: Rogene Houston, MD;  Location: [facility];  Service: Endoscopy;  Laterality: N/A;  10:30   POLYPECTOMY  11/06/2020   Procedure: POLYPECTOMY;  Surgeon: Rogene Houston, MD;  Location: [facility];  Service: Endoscopy;;   TONSILLECTOMY      Current  Medications: Current Meds  Medication Sig   aspirin EC 81 MG tablet Take 1 tablet (81 mg total) by mouth daily.   atorvastatin (LIPITOR) 10 MG tablet Take 10 mg by mouth daily.   cetirizine (ZYRTEC) 10 MG tablet Take 10 mg by mouth daily as needed for allergies.   diclofenac Sodium (VOLTAREN) 1 % GEL Apply 1 application topically daily as needed (pain).   EPINEPHrine 0.3 mg/0.3 mL IJ SOAJ injection Inject 0.3 mg into the muscle as needed for anaphylaxis (Bee stings).   famotidine (PEPCID) 40 MG tablet Take 40 mg by mouth daily.   fluticasone (FLONASE) 50 MCG/ACT nasal spray Place 1 spray into both nostrils daily as needed for allergies or rhinitis.   lisinopril (ZESTRIL) 5 MG tablet Take 5 mg by mouth daily.   Multiple Vitamins-Minerals (MULTIVITAMIN WITH MINERALS) tablet Take 1 tablet by mouth daily.   Polyethyl Glycol-Propyl Glycol 0.4-0.3 % SOLN Place 1-2 drops into both eyes daily as needed (dry eyes).   polyethylene glycol-electrolytes (TRILYTE) 420 g solution Take 4,000 mLs by mouth as directed.   testosterone cypionate (DEPOTESTOSTERONE CYPIONATE) 200 MG/ML injection Inject 200 mg into the muscle every 21 ( twenty-one) days.     Allergies:   Bee venom   Social History   Socioeconomic History   Marital  status: Married    Spouse name: Not on file   Number of children: Not on file   Years of education: Not on file   Highest education level: Not on file  Occupational History   Not on file  Tobacco Use   Smoking status: Former    Packs/day: 1.50    Years: 20.00    Pack years: 30.00    Types: Cigarettes   Smokeless tobacco: Never  Vaping Use   Vaping Use: Never used  Substance and Sexual Activity   Alcohol use: Yes    Comment: wine   Drug use: No   Sexual activity: Not on file  Other Topics Concern   Not on file  Social History Narrative   Not on file   Social Determinants of Health   Financial Resource Strain: Not on file  Food Insecurity: Not on file   Transportation Needs: Not on file  Physical Activity: Not on file  Stress: Not on file  Social Connections: Not on file     Family History: The patient's family history is negative for Colon cancer.  ROS:   Please see the history of present illness.    No fevers chills bleeding orthopnea all other systems reviewed and are negative.  EKGs/Labs/Other Studies Reviewed:    The following studies were reviewed today: Prior office notes, EKG personally reviewed and interpreted.  Lab work personally reviewed and interpreted no abnormalities, normal TSH.  Normal electrolytes.  EKG:  EKG from outside office shows sinus rhythm first-degree AV block left bundle branch block  Recent Labs: No results found for requested labs within last 8760 hours.  Recent Lipid Panel No results found for: CHOL, TRIG, HDL, CHOLHDL, VLDL, LDLCALC, LDLDIRECT   Risk Assessment/Calculations:          Physical Exam:    VS:  BP 140/86   Pulse 67   Ht 6\' 1"  (1.854 m)   Wt 214 lb 3.2 oz (97.2 kg)   SpO2 97%   BMI 28.26 kg/m     Wt Readings from Last 3 Encounters:  12/04/20 214 lb 3.2 oz (97.2 kg)  11/06/20 210 lb (95.3 kg)  08/10/17 210 lb (95.3 kg)     GEN:  Well nourished, well developed in no acute distress HEENT: Normal NECK: No JVD; No carotid bruits LYMPHATICS: No lymphadenopathy CARDIAC: RRR, no murmurs, rubs, gallops RESPIRATORY:  Clear to auscultation without rales, wheezing or rhonchi  ABDOMEN: Soft, non-tender, non-distended MUSCULOSKELETAL:  No edema; No deformity  SKIN: Warm and dry NEUROLOGIC:  Alert and oriented x 3, muscle fasciculations noted PSYCHIATRIC:  Normal affect   ASSESSMENT:    1. Hypertension, unspecified type   2. Nonspecific abnormal electrocardiogram (ECG) (EKG)   3. Frequent headaches   4. Syncope, unspecified syncope type   5. Left bundle branch block   6. Pure hypercholesterolemia   7. Chronic tension-type headache, not intractable   8. Chest pain of  uncertain etiology    PLAN:    In order of problems listed above:  Left bundle branch block I will check an echocardiogram to ensure proper structure and function of his heart.  I will also check a Zio patch monitor to ensure that he does not have any worsening conduction disease/high degree AV block.  One of his brothers has a pacemaker one of his brothers has ICD.  Pure hypercholesterolemia Currently on aspirin 81 mg, atorvastatin 10 mg.  Also on lisinopril 5 mg a day.  Chronic headaches We will go  ahead and check carotid Dopplers.  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  Chest pain of uncertain etiology Rare, substernal chest discomfort.  Sounded atypical.  If symptoms worsen or become more worrisome, further cardiac testing may be warranted.  For now, an echocardiogram would be an excellent start.      Medication Adjustments/Labs and Tests Ordered: Current medicines are reviewed at length with the patient today.  Concerns regarding medicines are outlined above.  Orders Placed This Encounter  Procedures   US Carotid Bilateral   LONG TERM MONITOR (3-14 DAYS)   EKG 12-Lead   ECHOCARDIOGRAM COMPLETE   No orders of the defined types were placed in this encounter.   Patient Instructions  Medication Instructions:  The current medical regimen is effective;  continue present plan and medications.  *If you need a refill on your cardiac medications before your next appointment, please call your pharmacy*  Testing/Procedures: Your physician has requested that you have an echocardiogram. Echocardiography is a painless test that uses sound waves to create images of your heart. It provides your doctor with information about the size and shape of your heart and how well your heart's chambers and valves are working. This procedure takes approximately one hour. There are no restrictions for this procedure.  Your physician has requested that you have a carotid duplex. This test is  an ultrasound of the carotid arteries in your neck. It looks at blood flow through these arteries that supply the brain with blood. Allow one hour for this exam. There are no restrictions or special instructions.  ZIO XT- Long Term Monitor Instructions  Your physician has requested you wear a ZIO patch monitor for 14 days.  This is a single patch monitor. [employer] supplies one patch monitor per enrollment. Additional stickers are not available. Please do not apply patch if you will be having a Nuclear Stress Test,  Echocardiogram, Cardiac CT, MRI, or Chest Xray during the period you would be wearing the  monitor. The patch cannot be worn during these tests. You cannot remove and re-apply the  ZIO XT patch monitor.  Your ZIO patch monitor will be mailed 3 day USPS to your address on file. It may take 3-5 days  to receive your monitor after you have been enrolled.  Once you have received your monitor, please review the enclosed instructions. Your monitor  has already been registered assigning a specific monitor serial # to you.  Billing and Patient Assistance Program Information  We have supplied [employer] with any of your insurance information on file for billing purposes. [employer] offers a sliding scale Patient Assistance Program for patients that do not have  insurance, or whose insurance does not completely cover the cost of the ZIO monitor.  You must apply for the Patient Assistance Program to qualify for this discounted rate.  To apply, please call [employer] at [phone], select option 4, select option 2, ask to apply for  Patient Assistance Program. Theodore Demark will ask your household income, and how many people  are in your household. They will quote your out-of-pocket cost based on that information.  [employer] will also be able to set up a 22-month, interest-free payment plan if needed.  Applying the monitor   Shave hair from upper left chest.  Hold abrader disc by orange tab. Rub abrader  in 40 strokes over the upper left chest as  indicated in your monitor instructions.  Clean area with 4 enclosed alcohol pads. Let dry.  Apply patch as  indicated in monitor instructions. Patch will be placed under collarbone on left  side of chest with arrow pointing upward.  Rub patch adhesive wings for 2 minutes. Remove white label marked "1". Remove the white  label marked "2". Rub patch adhesive wings for 2 additional minutes.  While looking in a mirror, press and release button in center of patch. A small green light will  flash 3-4 times. This will be your only indicator that the monitor has been turned on.  Do not shower for the first 24 hours. You may shower after the first 24 hours.  Press the button if you feel a symptom. You will hear a small click. Record Date, Time and  Symptom in the Patient Logbook.  When you are ready to remove the patch, follow instructions on the last 2 pages of Patient  Logbook. Stick patch monitor onto the last page of Patient Logbook.  Place Patient Logbook in the blue and white box. Use locking tab on box and tape box closed  securely. The blue and white box has prepaid postage on it. Please place it in the mailbox as  soon as possible. Your physician should have your test results approximately 7 days after the  monitor has been mailed back to [facility].  Call [place] at [phone] if you have questions regarding  your ZIO XT patch monitor. Call them immediately if you see an orange light blinking on your  monitor.  If your monitor falls off in less than 4 days, contact our Monitor department at [phone].  If your monitor becomes loose or falls off after 4 days call [employer] at [phone] for  suggestions on securing your monitor   Follow-Up: At [facility], you and your health needs are our priority.  As part of our continuing mission to provide you with exceptional heart care, we have created designated Provider  Care Teams.  These Care Teams include your primary Cardiologist (physician) and Advanced Practice Providers (APPs -  Physician Assistants and Nurse Practitioners) who all work together to provide you with the care you need, when you need it.  We recommend signing up for the patient portal called "MyChart".  Sign up information is provided on this After Visit Summary.  MyChart is used to connect with patients for Virtual Visits (Telemedicine).  Patients are able to view lab/test results, encounter notes, upcoming appointments, etc.  Non-urgent messages can be sent to your provider as well.   To learn more about what you can do with MyChart, go to [email or web address].    Your next appointment:   1 year(s)  The format for your next appointment:   In Person  Provider:   Dr Candee Furbish {   Thank you for choosing [facility]!!     Signed, Candee Furbish, MD  12/04/2020 3:28 PM    [place]

## 2020-12-04 NOTE — Assessment & Plan Note (Signed)
Currently on aspirin 81 mg, atorvastatin 10 mg.  Also on lisinopril 5 mg a day.

## 2020-12-04 NOTE — Assessment & Plan Note (Signed)
Rare, substernal chest discomfort.  Sounded atypical.  If symptoms worsen or become more worrisome, further cardiac testing may be warranted.  For now, an echocardiogram would be an excellent start.

## 2020-12-09 DIAGNOSIS — R519 Headache, unspecified: Secondary | ICD-10-CM | POA: Diagnosis not present

## 2020-12-09 DIAGNOSIS — R253 Fasciculation: Secondary | ICD-10-CM | POA: Diagnosis not present

## 2020-12-09 DIAGNOSIS — I10 Essential (primary) hypertension: Secondary | ICD-10-CM | POA: Diagnosis not present

## 2020-12-09 DIAGNOSIS — R413 Other amnesia: Secondary | ICD-10-CM | POA: Diagnosis not present

## 2020-12-09 DIAGNOSIS — R42 Dizziness and giddiness: Secondary | ICD-10-CM | POA: Diagnosis not present

## 2020-12-09 DIAGNOSIS — H9193 Unspecified hearing loss, bilateral: Secondary | ICD-10-CM | POA: Diagnosis not present

## 2020-12-09 DIAGNOSIS — Z86010 Personal history of colonic polyps: Secondary | ICD-10-CM | POA: Diagnosis not present

## 2020-12-12 ENCOUNTER — Ambulatory Visit (HOSPITAL_COMMUNITY)
Admission: RE | Admit: 2020-12-12 | Discharge: 2020-12-12 | Disposition: A | Discharge status: Home / Self Care | Payer: Medicare Other | Source: Ambulatory Visit | Attending: Cardiology | Admitting: Cardiology

## 2020-12-12 ENCOUNTER — Other Ambulatory Visit: Payer: Self-pay

## 2020-12-12 DIAGNOSIS — I6523 Occlusion and stenosis of bilateral carotid arteries: Secondary | ICD-10-CM | POA: Diagnosis not present

## 2020-12-12 DIAGNOSIS — R519 Headache, unspecified: Secondary | ICD-10-CM | POA: Insufficient documentation

## 2020-12-12 DIAGNOSIS — R55 Syncope and collapse: Secondary | ICD-10-CM | POA: Insufficient documentation

## 2020-12-14 NOTE — Progress Notes (Incomplete)
Assessment/Plan:   Andrew Mcgrath is a 80 y.o. year old male with a history of hypertension, hyperlipidemia, LBBB, B hearing loss, testosterone monthly injections,  RLS,chronic headaches, anxiety, depression and  seen today for evaluation of memory loss. MoCA today  CT head without contrast  remarkable for chronic small vessel ischemic change and brain atrophy.   Recommendations:   Memory Loss   Neurocognitive testing to further evaluate cognitive concerns and determine underlying cause of memory changes, including potential contribution from sleep, anxiety, or depression  Check B12, TSH Discussed safety both in and out of the home.  Discussed the importance of regular daily schedule with inclusion of crossword puzzles to maintain brain function.  Continue to monitor mood with PCP.  Stay active at least 30 minutes at least 3 times a week.  Naps should be scheduled and should be no longer than 60 minutes and should not occur after 2 PM.  Mediterranean diet is recommended  Folllow up once results above are available   Subjective:    The patient is seen in neurologic consultation at the request of Celene Squibb, MD for the evaluation of memory.  The patient is accompanied by  who supplements the history. This is a 80 y.o. year old male who has had memory issues for about     Memory Repeats same stories and asks same questions Does not know what came to the room for Leaving objects  Drive Lives with Mood Depression Irritability CW puzzles Word Finding Board Games Painting Coloring Sleeps Vivid Dreams Sleepwalking Hallucinations Paranoia Hygiene concerns Bathing Dressing Medications pillbox Finances  Appetite  trouble swallowing.  Cooks.  stove on or the faucet on. Ambulates   Falls Head injuries    Denies headaches, double vision, dizziness, focal numbness or tingling, unilateral weakness or tremors or anosmia. No history of seizures. Denies urine  incontinence, retention, constipation or diarrhea.  Denies anosmia.  OSA, ETOH or Tobacco. Family History Mo Dementia (does not know if it was AD)   CT head without contrast 11/28/20  No acute intracranial abnormalities. Chronic small vessel ischemic change and brain atrophy.  Allergies  Allergen Reactions   Bee Venom Swelling    Current Outpatient Medications  Medication Instructions   aspirin EC 81 mg, Oral, Daily   atorvastatin (LIPITOR) 10 mg, Oral, Daily   cetirizine (ZYRTEC) 10 mg, Oral, Daily PRN   diclofenac Sodium (VOLTAREN) 1 % GEL 1 application, Topical, Daily PRN   EPINEPHrine (EPI-PEN) 0.3 mg, Intramuscular, As needed   famotidine (PEPCID) 40 mg, Oral, Daily   fluticasone (FLONASE) 50 MCG/ACT nasal spray 1 spray, Each Nare, Daily PRN   lisinopril (ZESTRIL) 5 mg, Oral, Daily   Multiple Vitamins-Minerals (MULTIVITAMIN WITH MINERALS) tablet 1 tablet, Oral, Daily   Polyethyl Glycol-Propyl Glycol 0.4-0.3 % SOLN 1-2 drops, Both Eyes, Daily PRN   polyethylene glycol-electrolytes (TRILYTE) 420 g solution 4,000 mLs, Oral, As directed   testosterone cypionate (DEPOTESTOSTERONE CYPIONATE) 200 mg, Intramuscular, Every 21 days     VITALS:  There were no vitals filed for this visit. No flowsheet data found.  PHYSICAL EXAM   HEENT:  Normocephalic, atraumatic. The mucous membranes are moist. The superficial temporal arteries are without ropiness or tenderness. Cardiovascular: Regular rate and rhythm. Lungs: Clear to auscultation bilaterally. Neck: There are no carotid bruits noted bilaterally.  NEUROLOGICAL: No flowsheet data found. No flowsheet data found.  No flowsheet data found.   Orientation:  Alert and oriented to person, place and time. No  aphasia or dysarthria. Fund of knowledge is appropriate. Recent memory impaired and remote memory intact.  Attention and concentration are normal.  Able to name objects and repeat phrases. Delayed recall  /5 Cranial nerves: There is  good facial symmetry. Extraocular muscles are intact and visual fields are full to confrontational testing. Speech is fluent and clear. Soft palate rises symmetrically and there is no tongue deviation. Hearing is intact to conversational tone. Tone: Tone is good throughout. Sensation: Sensation is intact to light touch and pinprick throughout. Vibration is intact at the bilateral big toe.There is no extinction with double simultaneous stimulation. There is no sensory dermatomal level identified. Coordination: The patient has no difficulty with RAM's or FNF bilaterally. Normal finger to nose  Motor: Strength is 5/5 in the bilateral upper and lower extremities. There is no pronator drift. There are no fasciculations noted. DTR's: Deep tendon reflexes are 2/4 at the bilateral biceps, triceps, brachioradialis, patella and achilles.  Plantar responses are downgoing bilaterally. Gait and Station: The patient is able to ambulate without difficulty.The patient is able to heel toe walk without any difficulty.The patient is able to ambulate in a tandem fashion. The patient is able to stand in the Romberg position.     Thank you for allowing [place] the opportunity to participate in the care of this nice patient. Please do not hesitate to contact us for any questions or concerns.   Total time spent on today's visit was 60 minutes, including both face-to-face time and nonface-to-face time.  Time included that spent on review of records (prior notes available to me/labs/imaging if pertinent), discussing treatment and goals, answering patient's questions and coordinating care.  Cc:  Celene Squibb, MD  Sharene Butters 12/14/2020 4:58 PM

## 2020-12-15 ENCOUNTER — Ambulatory Visit: Payer: Medicare Other | Admitting: Physician Assistant

## 2020-12-17 ENCOUNTER — Telehealth: Payer: Self-pay | Admitting: Cardiology

## 2020-12-17 MED ORDER — ATORVASTATIN CALCIUM 20 MG PO TABS: 20.0000 mg | ORAL_TABLET | Freq: Every day | ORAL | 3 refills | Status: DC

## 2020-12-17 NOTE — Telephone Encounter (Signed)
   Pt is returning call to get carotid result # [phone]

## 2020-12-17 NOTE — Telephone Encounter (Signed)
Pt's wife Gaston Dase notified of carotid result and increase in Lipitor. Wife voiced understanding.

## 2020-12-18 DIAGNOSIS — R9431 Abnormal electrocardiogram [ECG] [EKG]: Secondary | ICD-10-CM | POA: Diagnosis not present

## 2020-12-18 DIAGNOSIS — R55 Syncope and collapse: Secondary | ICD-10-CM | POA: Diagnosis not present

## 2020-12-25 ENCOUNTER — Ambulatory Visit: Payer: Medicare Other | Admitting: Physician Assistant

## 2020-12-29 DIAGNOSIS — R9431 Abnormal electrocardiogram [ECG] [EKG]: Secondary | ICD-10-CM | POA: Diagnosis not present

## 2020-12-29 DIAGNOSIS — R55 Syncope and collapse: Secondary | ICD-10-CM | POA: Diagnosis not present

## 2021-01-15 ENCOUNTER — Other Ambulatory Visit: Payer: Self-pay

## 2021-01-15 ENCOUNTER — Ambulatory Visit (HOSPITAL_COMMUNITY)
Admission: RE | Admit: 2021-01-15 | Discharge: 2021-01-15 | Disposition: A | Discharge status: Home / Self Care | Payer: Medicare Other | Source: Ambulatory Visit | Attending: Cardiology | Admitting: Cardiology

## 2021-01-15 DIAGNOSIS — R55 Syncope and collapse: Secondary | ICD-10-CM | POA: Insufficient documentation

## 2021-01-15 DIAGNOSIS — R9431 Abnormal electrocardiogram [ECG] [EKG]: Secondary | ICD-10-CM | POA: Diagnosis not present

## 2021-01-15 LAB — ECHOCARDIOGRAM COMPLETE
AR max vel: 1.72 cm2
AV Area VTI: 2 cm2
AV Area mean vel: 1.63 cm2
AV Mean grad: 4 mmHg
AV Peak grad: 7.1 mmHg
Ao pk vel: 1.33 m/s
Area-P 1/2: 5.02 cm2
S' Lateral: 3.1 cm

## 2021-01-15 NOTE — Progress Notes (Signed)
*  PRELIMINARY RESULTS* Echocardiogram 2D Echocardiogram has been performed.  Andrew Mcgrath 01/15/2021, 3:51 PM

## 2021-01-20 ENCOUNTER — Telehealth: Payer: Self-pay | Admitting: *Deleted

## 2021-01-20 NOTE — Telephone Encounter (Signed)
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  -Lets go ahead and start Toprol-XL 25 mg once a day  -Lets have him come back in for a visit in the next few weeks with [place] to potentially start Entresto.   Candee Furbish, MD   Left message for pt to call back to discuss the above information.

## 2021-01-21 DIAGNOSIS — H838X3 Other specified diseases of inner ear, bilateral: Secondary | ICD-10-CM | POA: Diagnosis not present

## 2021-01-21 DIAGNOSIS — H6123 Impacted cerumen, bilateral: Secondary | ICD-10-CM | POA: Diagnosis not present

## 2021-01-21 DIAGNOSIS — H903 Sensorineural hearing loss, bilateral: Secondary | ICD-10-CM | POA: Diagnosis not present

## 2021-01-21 MED ORDER — METOPROLOL SUCCINATE ER 25 MG PO TB24: 25.0000 mg | ORAL_TABLET | Freq: Every day | ORAL | 3 refills | Status: DC

## 2021-01-21 NOTE — Telephone Encounter (Signed)
Spoke to Andrew Mcgrath's wife who verbalized understanding. Andrew Mcgrath has 1st available appt with Shary Key, MD on 02/23/2021 @ 1pm.

## 2021-01-21 NOTE — Telephone Encounter (Signed)
Patient's wife returning call. 

## 2021-01-29 ENCOUNTER — Ambulatory Visit (INDEPENDENT_AMBULATORY_CARE_PROVIDER_SITE_OTHER): Payer: Medicare Other | Admitting: Physician Assistant

## 2021-01-29 ENCOUNTER — Other Ambulatory Visit (INDEPENDENT_AMBULATORY_CARE_PROVIDER_SITE_OTHER): Payer: Medicare Other

## 2021-01-29 ENCOUNTER — Other Ambulatory Visit: Payer: Self-pay

## 2021-01-29 ENCOUNTER — Encounter: Payer: Self-pay | Admitting: Physician Assistant

## 2021-01-29 VITALS — BP 153/86 | HR 56 | Resp 20 | Ht 73.0 in | Wt 213.0 lb

## 2021-01-29 DIAGNOSIS — R413 Other amnesia: Secondary | ICD-10-CM | POA: Insufficient documentation

## 2021-01-29 LAB — VITAMIN B12: Vitamin B-12: 404 pg/mL (ref 211–911)

## 2021-01-29 LAB — TSH: TSH: 3.67 u[IU]/mL (ref 0.35–5.50)

## 2021-01-29 NOTE — Progress Notes (Addendum)
Assessment/Plan:   Andrew Mcgrath is a very pleasant 81 y.o. 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 MoCA today was normal at 29/30, with delayed recall 4/5, however there are some concerns for mild cognitive impairment despite normal testing.  Recent CT of the head shows cerebral atrophy, as well as chronic small vessel ischemic changes.    Recommendations:   Memory difficulties   MRI brain without contrast to assess for underlying structural abnormality and assess vascular load  Neurocognitive testing to further evaluate cognitive concerns and determine underlying cause of memory changes, including potential contribution from sleep, anxiety, or depression  Check B12, TSH Discussed safety both in and out of the home.  Discussed the importance of regular daily schedule with inclusion of crossword puzzles to maintain brain function.  Continue to monitor mood with PCP.  Stay active at least 30 minutes at least 3 times a week.  Naps should be scheduled and should be no longer than 60 minutes and should not occur after 2 PM.  Control cardiovascular risk factors  Mediterranean diet is recommended  Folllow up once results above are available   Subjective:    The patient is seen in neurologic consultation at the request of Celene Squibb, MD for the evaluation of memory.  The patient is accompanied by his wife who supplements the history. This is a very pleasant 81 y.o. year old RH  male who had recent memory changes.  He was in his usual state of health, when at the end of October noticed a "different type of headache in the back of the head, lasting for a few hours, as well as  ".  Because his wife had COVID, he did not want to tell her, waiting  around several days to do so.  She went to her PCP with him, CT scan of the head was ordered, which revealed cerebral atrophy, and mild chronic ischemic changes, without any acute findings such as stroke, masses or bleed.  He was referred here for further evaluation of cognitive difficulties, as his wife was concerned.  He reports that he has never been good with names, but his wife states that this may be worse, and at times asks the same questions.  He denies leaving objects in unusual places.  He does not notice any issues with driving.  He denies any depression or irritability or any other emotional changes.  He does not sleep well, has insomnia, because of restless legs.  He denies vivid dreams or sleepwalking, hallucinations or paranoia.  There are no hygiene concerns. He takes his own medications without missing any doses.  He is in charge of his finances.  His appetite is good, denies any trouble swallowing.  He cooks and denies leaving the stove on.  He ambulates without difficulty, denies any recent falls or head injuries.  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  At times, he may have dizziness in view of possible orthostatic hypotension, but he had a recent carotid ultrasound which was unremarkable.  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  He denies any recent COVID.  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  As mentioned above, he has a history of benign fasciculation cramp syndrome, with 3 separate EMGs over the period of 30 years, which yielded negative results.  He does not wish to undergo any EMGs/NCS at this time "I'm ok".  He denies urine incontinence, or retention, constipation or diarrhea.  Denies a history of sleep apnea, alcohol or tobacco.  Family history significant for mother and grandmother with  Alzheimer's disease.     CT of the head without contrast 11/28/2020 remarkable for chronic small vessel ischemic changes,  brain atrophy, without acute intracranial abnormalities   Allergies  Allergen Reactions   Bee Venom Swelling    Current Outpatient Medications  Medication Instructions   aspirin EC 81 mg, Oral, Daily   atorvastatin (LIPITOR) 20 mg, Oral, Daily   cetirizine (ZYRTEC) 10 mg, Oral, Daily PRN   diclofenac Sodium (VOLTAREN) 1 % GEL 1 application, Topical, Daily PRN   EPINEPHrine (EPI-PEN) 0.3 mg, Intramuscular, As needed   famotidine (PEPCID) 40 mg, Oral, Daily   fluticasone (FLONASE) 50 MCG/ACT nasal spray 1 spray, Each Nare, Daily PRN   lisinopril (ZESTRIL) 5 mg, Oral, Daily   methylPREDNISolone (MEDROL DOSEPAK) 4 MG TBPK tablet Oral   metoprolol succinate (TOPROL XL) 25 mg, Oral, Daily   Multiple Vitamins-Minerals (MULTIVITAMIN WITH MINERALS) tablet 1 tablet, Oral, Daily   penicillin v potassium (VEETID) 500 mg, Oral, 2 times daily   Polyethyl Glycol-Propyl Glycol 0.4-0.3 % SOLN 1-2 drops, Both Eyes, Daily PRN   polyethylene glycol-electrolytes (TRILYTE) 420 g solution 4,000 mLs, Oral, As directed   testosterone cypionate (DEPOTESTOSTERONE CYPIONATE) 200 mg, Intramuscular, Every 21 days     VITALS:   Vitals:   01/29/21 0947  BP: (!) 153/86  Pulse: (!) 56  Resp: 20  SpO2: 97%  Weight: 213 lb (96.6 kg)  Height: 6\' 1"  (1.854 m)   No flowsheet data found.  PHYSICAL EXAM   HEENT:  Normocephalic, atraumatic. The mucous membranes are moist. The superficial temporal arteries are without ropiness or tenderness. Cardiovascular: Regular rate and rhythm. Lungs: Clear to auscultation bilaterally. Neck: There are no carotid bruits noted bilaterally.  NEUROLOGICAL: Montreal Cognitive Assessment  01/29/2021  Visuospatial/ Executive (0/5) 5  Naming (0/3) 3  Attention: Read list of digits (0/2) 2  Attention: Read list of letters (0/1) 1  Attention: Serial 7  subtraction starting at 100 (0/3) 3  Language: Repeat phrase (0/2) 2  Language : Fluency (0/1) 0  Abstraction (0/2) 2  Delayed Recall (0/5) 4  Orientation (0/6) 6  Total 28  Adjusted Score (based on education) 29   No flowsheet data found.  No flowsheet data found.   Orientation:  Alert and oriented to person, place and time. No aphasia or dysarthria. Fund of knowledge is appropriate. Recent memory mildly impaired and remote memory intact.  Attention and concentration are normal.  Able to name objects and repeat phrases. Delayed recall  4/5 Cranial nerves: There is good facial symmetry. Extraocular muscles are intact and visual fields are full to confrontational testing. Speech is fluent and clear. Soft palate rises symmetrically and there is no tongue deviation. Hearing is intact to conversational tone. Tone: Tone is good throughout. Sensation: Sensation is intact to light touch and pinprick throughout. Vibration is intact at the bilateral big toe.There is no extinction with double simultaneous stimulation. There is no sensory dermatomal level identified. Coordination: The patient has no difficulty with RAM's or FNF bilaterally. Normal finger to nose  Motor: Strength is 5/5 in the  bilateral upper and lower extremities. There is no pronator drift. There are no fasciculations noted. DTR's: Deep tendon reflexes are 2/4 at the bilateral biceps, triceps, brachioradialis, patella and achilles.  Plantar responses are downgoing bilaterally. Gait and Station: The patient is able to ambulate without difficulty.The patient is able to heel toe walk without any difficulty.The patient is able to ambulate in a tandem fashion. The patient is able to stand in the Romberg position.     Thank you for allowing [place] the opportunity to participate in the care of this nice patient. Please do not hesitate to contact us for any questions or concerns.   Total time spent on today's visit was 60 minutes, including both  face-to-face time and nonface-to-face time.  Time included that spent on review of records (prior notes available to me/labs/imaging if pertinent), discussing treatment and goals, answering patient's questions and coordinating care.  Cc:  Celene Squibb, MD  Sharene Butters 01/29/2021 10:41 AM

## 2021-01-29 NOTE — Patient Instructions (Addendum)
It was a pleasure to see you today at our office.   Recommendations:  Neurocognitive evaluation at our office MRI of the brain, the radiology office will call you to arrange you appointment Check labs today Follow up after neurocognitive testing  RECOMMENDATIONS FOR ALL PATIENTS WITH MEMORY PROBLEMS:  1. Continue to exercise (Recommend 30 minutes of walking everyday, or 3 hours every week) 2. Increase social interactions - continue going to [place] and enjoy social gatherings with friends and family 3. Eat healthy, avoid fried foods and eat more fruits and vegetables 4. Maintain adequate blood pressure, blood sugar, and blood cholesterol level. Reducing the risk of stroke and cardiovascular disease also helps promoting better memory. 5. Avoid stressful situations. Live a simple life and avoid aggravations. Organize your time and prepare for the next day in anticipation. 6. Sleep well, avoid any interruptions of sleep and avoid any distractions in the bedroom that may interfere with adequate sleep quality 7. Avoid sugar, avoid sweets as there is a strong link between excessive sugar intake, diabetes, and cognitive impairment We discussed the Mediterranean diet, which has been shown to help patients reduce the risk of progressive memory disorders and reduces cardiovascular risk. This includes eating fish, eat fruits and green leafy vegetables, nuts like almonds and hazelnuts, walnuts, and also use olive oil. Avoid fast foods and fried foods as much as possible. Avoid sweets and sugar as sugar use has been linked to worsening of memory function.  There is always a concern of gradual progression of memory problems. If this is the case, then we may need to adjust level of care according to patient needs. Support, both to the patient and caregiver, should then be put into place.      You have been referred for a neuropsychological evaluation (i.e., evaluation of memory and thinking abilities).  Please bring someone with you to this appointment if possible, as it is helpful for the doctor to hear from both you and another adult who knows you well. Please bring eyeglasses and hearing aids if you wear them.    The evaluation will take approximately 3 hours and has two parts:   The first part is a clinical interview with the neuropsychologist (Dr. Melvyn Novas or Dr. Nicole Kindred). During the interview, the neuropsychologist will speak with you and the individual you brought to the appointment.    The second part of the evaluation is testing with the doctor's technician Hinton Dyer or Maudie Mercury). During the testing, the technician will ask you to remember different types of material, solve problems, and answer some questionnaires. Your family member will not be present for this portion of the evaluation.   Please note: We must reserve several hours of the neuropsychologist's time and the psychometrician's time for your evaluation appointment. As such, there is a No-Show fee of $100. If you are unable to attend any of your appointments, please contact our office as soon as possible to reschedule.    FALL PRECAUTIONS: Be cautious when walking. Scan the area for obstacles that may increase the risk of trips and falls. When getting up in the mornings, sit up at the edge of the bed for a few minutes before getting out of bed. Consider elevating the bed at the head end to avoid drop of blood pressure when getting up. Walk always in a well-lit room (use night lights in the walls). Avoid area rugs or power cords from appliances in the middle of the walkways. Use a walker or a cane if necessary  and consider physical therapy for balance exercise. Get your eyesight checked regularly.  FINANCIAL OVERSIGHT: Supervision, especially oversight when making financial decisions or transactions is also recommended.  HOME SAFETY: Consider the safety of the kitchen when operating appliances like stoves, microwave oven, and blender. Consider  having supervision and share cooking responsibilities until no longer able to participate in those. Accidents with firearms and other hazards in the house should be identified and addressed as well.   ABILITY TO BE LEFT ALONE: If patient is unable to contact 911 operator, consider using LifeLine, or when the need is there, arrange for someone to stay with patients. Smoking is a fire hazard, consider supervision or cessation. Risk of wandering should be assessed by caregiver and if detected at any point, supervision and safe proof recommendations should be instituted.  MEDICATION SUPERVISION: Inability to self-administer medication needs to be constantly addressed. Implement a mechanism to ensure safe administration of the medications.   DRIVING: Regarding driving, in patients with progressive memory problems, driving will be impaired. We advise to have someone else do the driving if trouble finding directions or if minor accidents are reported. Independent driving assessment is available to determine safety of driving.   If you are interested in the driving assessment, you can contact the following:  [employer] in [place]  [facility] [phone] or [phone]    [place] refers to food and lifestyle choices that are based on the traditions of countries located on the [employer]. This way of eating has been shown to help prevent certain conditions and improve outcomes for people who have chronic diseases, like kidney disease and heart disease. What are tips for following this plan? Lifestyle  Cook and eat meals together with your family, when possible. Drink enough fluid to keep your urine clear or pale yellow. Be physically active every day. This includes: Aerobic exercise like running or swimming. Leisure activities like  gardening, walking, or housework. Get 7-8 hours of sleep each night. If recommended by your health care provider, drink red wine in moderation. This means 1 glass a day for nonpregnant women and 2 glasses a day for men. A glass of wine equals 5 oz (150 mL). Reading food labels  Check the serving size of packaged foods. For foods such as rice and pasta, the serving size refers to the amount of cooked product, not dry. Check the total fat in packaged foods. Avoid foods that have saturated fat or trans fats. Check the ingredients list for added sugars, such as corn syrup. Shopping  At the grocery store, buy most of your food from the areas near the walls of the store. This includes: Fresh fruits and vegetables (produce). Grains, beans, nuts, and seeds. Some of these may be available in unpackaged forms or large amounts (in bulk). Fresh seafood. Poultry and eggs. Low-fat dairy products. Buy whole ingredients instead of prepackaged foods. Buy fresh fruits and vegetables in-season from local farmers markets. Buy frozen fruits and vegetables in resealable bags. If you do not have access to quality fresh seafood, buy precooked frozen shrimp or canned fish, such as tuna, salmon, or sardines. Buy small amounts of raw or cooked vegetables, salads, or olives from the deli or salad bar at your store. Stock your pantry so you always have certain foods on hand, such as olive oil, canned tuna, canned tomatoes, rice, pasta, and beans. Cooking  Cook foods with  extra-virgin olive oil instead of using butter or other vegetable oils. Have meat as a side dish, and have vegetables or grains as your main dish. This means having meat in small portions or adding small amounts of meat to foods like pasta or stew. Use beans or vegetables instead of meat in common dishes like chili or lasagna. Experiment with different cooking methods. Try roasting or broiling vegetables instead of steaming or sauteing them. Add frozen  vegetables to soups, stews, pasta, or rice. Add nuts or seeds for added healthy fat at each meal. You can add these to yogurt, salads, or vegetable dishes. Marinate fish or vegetables using olive oil, lemon juice, garlic, and fresh herbs. Meal planning  Plan to eat 1 vegetarian meal one day each week. Try to work up to 2 vegetarian meals, if possible. Eat seafood 2 or more times a week. Have healthy snacks readily available, such as: Vegetable sticks with hummus. Greek yogurt. Fruit and nut trail mix. Eat balanced meals throughout the week. This includes: Fruit: 2-3 servings a day Vegetables: 4-5 servings a day Low-fat dairy: 2 servings a day Fish, poultry, or lean meat: 1 serving a day Beans and legumes: 2 or more servings a week Nuts and seeds: 1-2 servings a day Whole grains: 6-8 servings a day Extra-virgin olive oil: 3-4 servings a day Limit red meat and sweets to only a few servings a month What are my food choices? Mediterranean diet Recommended Grains: Whole-grain pasta. Brown rice. Bulgar wheat. Polenta. Couscous. Whole-wheat bread. [place]. Vegetables: Artichokes. Beets. Broccoli. Cabbage. Carrots. Eggplant. Green beans. Chard. Kale. Spinach. Onions. Leeks. Peas. Squash. Tomatoes. Peppers. Radishes. Fruits: Apples. Apricots. Avocado. Berries. Bananas. Cherries. Dates. Figs. Grapes. Lemons. Melon. Oranges. Peaches. Plums. Pomegranate. Meats and other protein foods: Beans. Almonds. Sunflower seeds. Pine nuts. Peanuts. [place]. Salmon. Scallops. Shrimp. [place]. Tilapia. Clams. Oysters. Eggs. Dairy: Low-fat milk. Cheese. Greek yogurt. Beverages: Water. Red wine. Herbal tea. Fats and oils: Extra virgin olive oil. Avocado oil. Grape seed oil. Sweets and desserts: [place] yogurt with honey. Baked apples. Poached pears. Trail mix. Seasoning and other foods: Basil. Cilantro. Coriander. Cumin. Mint. Parsley. Sage. Rosemary. Tarragon. Garlic. Oregano. Thyme. Pepper. Balsalmic vinegar.  Tahini. Hummus. Tomato sauce. Olives. Mushrooms. Limit these Grains: Prepackaged pasta or rice dishes. Prepackaged cereal with added sugar. Vegetables: Deep fried potatoes (french fries). Fruits: Fruit canned in syrup. Meats and other protein foods: Beef. Pork. Lamb. Poultry with skin. Hot dogs. [place] Salines. Dairy: Ice cream. Sour cream. Whole milk. Beverages: Juice. Sugar-sweetened soft drinks. Beer. Liquor and spirits. Fats and oils: Butter. Canola oil. Vegetable oil. Beef fat (tallow). Lard. Sweets and desserts: Cookies. Cakes. Pies. Candy. Seasoning and other foods: Mayonnaise. Premade sauces and marinades. The items listed may not be a complete list. Talk with your dietitian about what dietary choices are right for you. Summary The Mediterranean diet includes both food and lifestyle choices. Eat a variety of fresh fruits and vegetables, beans, nuts, seeds, and whole grains. Limit the amount of red meat and sweets that you eat. Talk with your health care provider about whether it is safe for you to drink red wine in moderation. This means 1 glass a day for nonpregnant women and 2 glasses a day for men. A glass of wine equals 5 oz (150 mL). This information is not intended to replace advice given to you by your health care provider. Make sure you discuss any questions you have with your health care provider. Document Released: 09/04/2015 Document Revised: 10/07/2015 Document  Reviewed: 09/04/2015 Elsevier Interactive Patient Education  2017 [employer].

## 2021-01-29 NOTE — Telephone Encounter (Signed)
RX for Toprol 25 mg daily sent into pt's pharmacy on 01/21/2021.  F/u scheduled as documented.

## 2021-02-05 ENCOUNTER — Other Ambulatory Visit: Payer: Self-pay

## 2021-02-05 ENCOUNTER — Ambulatory Visit (HOSPITAL_COMMUNITY)
Admission: RE | Admit: 2021-02-05 | Discharge: 2021-02-05 | Disposition: A | Discharge status: Home / Self Care | Payer: Medicare Other | Source: Ambulatory Visit | Attending: Physician Assistant | Admitting: Physician Assistant

## 2021-02-05 ENCOUNTER — Encounter (HOSPITAL_COMMUNITY): Payer: Self-pay

## 2021-02-05 DIAGNOSIS — R413 Other amnesia: Secondary | ICD-10-CM

## 2021-02-06 ENCOUNTER — Other Ambulatory Visit: Payer: Self-pay

## 2021-02-06 ENCOUNTER — Telehealth: Payer: Self-pay | Admitting: Physician Assistant

## 2021-02-06 DIAGNOSIS — Z139 Encounter for screening, unspecified: Secondary | ICD-10-CM

## 2021-02-06 NOTE — Telephone Encounter (Signed)
Ordered, will call patient to let him know.

## 2021-02-06 NOTE — Telephone Encounter (Signed)
Patient advised.

## 2021-02-06 NOTE — Telephone Encounter (Signed)
Andrew Mcgrath from [facility] called and said they need an order for an xray for "one view abdomen."  He had a colonoscopy recently and had some clips placed. He will need the xray before the MRI.

## 2021-02-09 ENCOUNTER — Other Ambulatory Visit: Payer: Self-pay

## 2021-02-09 ENCOUNTER — Ambulatory Visit (HOSPITAL_COMMUNITY)
Admission: RE | Admit: 2021-02-09 | Discharge: 2021-02-09 | Disposition: A | Discharge status: Home / Self Care | Payer: Medicare Other | Source: Ambulatory Visit | Attending: Physician Assistant | Admitting: Physician Assistant

## 2021-02-09 DIAGNOSIS — Z139 Encounter for screening, unspecified: Secondary | ICD-10-CM | POA: Diagnosis not present

## 2021-02-09 DIAGNOSIS — I878 Other specified disorders of veins: Secondary | ICD-10-CM | POA: Diagnosis not present

## 2021-02-09 DIAGNOSIS — Z01818 Encounter for other preprocedural examination: Secondary | ICD-10-CM | POA: Insufficient documentation

## 2021-02-11 DIAGNOSIS — L821 Other seborrheic keratosis: Secondary | ICD-10-CM | POA: Diagnosis not present

## 2021-02-11 DIAGNOSIS — L578 Other skin changes due to chronic exposure to nonionizing radiation: Secondary | ICD-10-CM | POA: Diagnosis not present

## 2021-02-11 DIAGNOSIS — Z23 Encounter for immunization: Secondary | ICD-10-CM | POA: Diagnosis not present

## 2021-02-11 DIAGNOSIS — Z86018 Personal history of other benign neoplasm: Secondary | ICD-10-CM | POA: Diagnosis not present

## 2021-02-11 DIAGNOSIS — Z808 Family history of malignant neoplasm of other organs or systems: Secondary | ICD-10-CM | POA: Diagnosis not present

## 2021-02-11 DIAGNOSIS — D2272 Melanocytic nevi of left lower limb, including hip: Secondary | ICD-10-CM | POA: Diagnosis not present

## 2021-02-11 DIAGNOSIS — Z85828 Personal history of other malignant neoplasm of skin: Secondary | ICD-10-CM | POA: Diagnosis not present

## 2021-02-11 DIAGNOSIS — L814 Other melanin hyperpigmentation: Secondary | ICD-10-CM | POA: Diagnosis not present

## 2021-02-11 DIAGNOSIS — L570 Actinic keratosis: Secondary | ICD-10-CM | POA: Diagnosis not present

## 2021-02-11 DIAGNOSIS — D225 Melanocytic nevi of trunk: Secondary | ICD-10-CM | POA: Diagnosis not present

## 2021-02-19 DIAGNOSIS — M6281 Muscle weakness (generalized): Secondary | ICD-10-CM | POA: Diagnosis not present

## 2021-02-19 DIAGNOSIS — E291 Testicular hypofunction: Secondary | ICD-10-CM | POA: Diagnosis not present

## 2021-02-19 DIAGNOSIS — E782 Mixed hyperlipidemia: Secondary | ICD-10-CM | POA: Diagnosis not present

## 2021-02-19 DIAGNOSIS — R7301 Impaired fasting glucose: Secondary | ICD-10-CM | POA: Diagnosis not present

## 2021-02-21 NOTE — Progress Notes (Signed)
Cardiology Office Note:    Date:  02/23/2021   ID:  Andrew Mcgrath, DOB [birth year], MRN [id number]  PCP:  Celene Squibb, MD   [facility] Providers Cardiologist:  None {   Referring MD: Celene Squibb, MD     History of Present Illness:    Andrew Mcgrath is a 81 y.o. male with a hx of LBBB, HLD, HTN and GERD who was initially seen by Dr. Marlou Porch for evaluation of LBBB who now presents to clinic for follow-up.  Was last seen in clinic on 11/2020 for evaluation of LBBB. Was also reporting some weakness and episodes of HR dropping into the 50s. TTE 12/2020 with EF about 40% with hypokinesis in the inferior and inferoseptal walls, normal RV, no significant valve disease. Cardiac monitor 12/2020 with NSR with 1st degree AVB. Brief Atach but no Afib or high degree AVB. Carotids were also obtained due to HA which showed <50% bilaterally. He was started on metoprolol and recommended for follow-up.  Today, the patient states he is overall doing well. No chest pain, SOB, LE edema, orthopnea or PND. He is active and is able to walk 2-75miles per day. He states that he had a prior cath when he was in his 64s and was told that the "posterior wall was weaker." He has no known history of MI and no prior stent placement. While his resting HR is low, he augments up to 110 with exertion per his watch. Blood pressures are also well controlled on home BP cuff ranging 110-130s.  Past Medical History:  Diagnosis Date   Bradycardia    Familial restless leg syndrome    GERD (gastroesophageal reflux disease)    Hyperlipidemia     Past Surgical History:  Procedure Laterality Date   APPENDECTOMY     CARDIAC CATHETERIZATION     COLONOSCOPY     COLONOSCOPY N/A 08/23/2013   Procedure: COLONOSCOPY;  Surgeon: Rogene Houston, MD;  [facility];  Service: Endoscopy;  Laterality: N/A;  1200   COLONOSCOPY N/A 11/06/2020   Procedure: COLONOSCOPY;  Surgeon: Rogene Houston, MD;  [facility];  Service: Endoscopy;  Laterality: N/A;  10:30   POLYPECTOMY  11/06/2020   Procedure: POLYPECTOMY;  Surgeon: Rogene Houston, MD;  [facility];  Service: Endoscopy;;   TONSILLECTOMY      Current Medications: Current Meds  Medication Sig   aspirin EC 81 MG tablet Take 1 tablet (81 mg total) by mouth daily.   atorvastatin (LIPITOR) 20 MG tablet Take 1 tablet (20 mg total) by mouth daily.   cetirizine (ZYRTEC) 10 MG tablet Take 10 mg by mouth daily as needed for allergies.   diclofenac Sodium (VOLTAREN) 1 % GEL Apply 1 application topically daily as needed (pain).   EPINEPHrine 0.3 mg/0.3 mL IJ SOAJ injection Inject 0.3 mg into the muscle as needed for anaphylaxis (Bee stings).   famotidine (PEPCID) 40 MG tablet Take 40 mg by mouth daily.   fluticasone (FLONASE) 50 MCG/ACT nasal spray Place 1 spray into both nostrils daily as needed for allergies or rhinitis.   metoprolol succinate (TOPROL XL) 25 MG 24 hr tablet Take 1 tablet (25 mg total) by mouth daily.   Multiple Vitamins-Minerals (MULTIVITAMIN WITH MINERALS) tablet Take 1 tablet by mouth daily.   Polyethyl Glycol-Propyl Glycol 0.4-0.3 % SOLN Place 1-2 drops into both eyes daily as needed (dry eyes).   polyethylene glycol-electrolytes (TRILYTE) 420 g solution Take 4,000 mLs  by mouth as directed.   sacubitril-valsartan (ENTRESTO) 24-26 MG Take 1 tablet by mouth 2 (two) times daily.   testosterone cypionate (DEPOTESTOSTERONE CYPIONATE) 200 MG/ML injection Inject 200 mg into the muscle every 21 ( twenty-one) days.   [DISCONTINUED] lisinopril (ZESTRIL) 5 MG tablet Take 5 mg by mouth daily.     Allergies:   Bee venom   Social History   Socioeconomic History   Marital status: Married    Spouse name: Not on file   Number of children: 3   Years of education: 12   Highest education level: Not on file  Occupational History   Not on file  Tobacco Use   Smoking status: Former    Packs/day: 1.50    Years: 20.00    Pack  years: 30.00    Types: Cigarettes   Smokeless tobacco: Never  Vaping Use   Vaping Use: Never used  Substance and Sexual Activity   Alcohol use: Yes    Comment: wine   Drug use: No   Sexual activity: Not on file  Other Topics Concern   Not on file  Social History Narrative   Right handed   Drinks caffeine    2 story home   Social Determinants of Health   Financial Resource Strain: Not on file  Food Insecurity: Not on file  Transportation Needs: Not on file  Physical Activity: Not on file  Stress: Not on file  Social Connections: Not on file     Family History: The patient's family history is negative for Colon cancer.  ROS:   Please see the history of present illness.    Review of Systems  Constitutional:  Negative for malaise/fatigue.  Respiratory:  Negative for shortness of breath.   Cardiovascular:  Negative for chest pain, palpitations, orthopnea, claudication, leg swelling and PND.  Gastrointestinal:  Negative for blood in stool.  Genitourinary:  Negative for hematuria.  Musculoskeletal:  Negative for falls.  Neurological:  Negative for dizziness and loss of consciousness.    EKGs/Labs/Other Studies Reviewed:    The following studies were reviewed today: TTE 24-Jan-2021: IMPRESSIONS     1. Difficult apical windows Images are foreshortened in some views  Overall LVEF is depressed with global hypokinesis, worse in the inferior,  inferoseptal walls.. Left ventricular ejection fraction, by estimation, is  40%%. The left ventricle has  moderately decreased function. The left ventricle demonstrates global  hypokinesis. Indeterminate diastolic filling due to E-A fusion.   2. Right ventricular systolic function is low normal. The right  ventricular size is normal.   3. Left atrial size was moderately dilated.   4. The mitral valve is normal in structure. No evidence of mitral valve  regurgitation.   5. The aortic valve is tricuspid. Aortic valve regurgitation is not   visualized.   6. The inferior vena cava is normal in size with greater than 50%  respiratory variability, suggesting right atrial pressure of 3 mmHg.   Cardiac monitor 01/24/21: Sinus rhythm first-degree AV block block with bundle branch block-average heart rate 67 bpm. No adverse arrhythmias detected Brief atrial tachycardia-benign No atrial fibrillation Rare PACs, PVCs Overall reassuring monitor. No evidence of high degree AV block. No significant pauses. Continue to avoid AV nodal blocking agents such as beta-blockers. No need for pacemaker.  Carotid Ultrasound 11/2020: RIGHT CAROTID ARTERY: Minor echogenic shadowing plaque formation. No hemodynamically significant right ICA stenosis, velocity elevation, or turbulent flow. Degree of narrowing less than 50%.   RIGHT VERTEBRAL ARTERY:  Normal  antegrade   LEFT CAROTID ARTERY: Similar scattered mild-to-moderate echogenic plaque formation. No hemodynamically significant left ICA stenosis, velocity elevation, or turbulent flow.   LEFT VERTEBRAL ARTERY:  Normal antegrade flow   IMPRESSION: Bilateral carotid atherosclerosis, worse on the left. Negative for stenosis. Degree of narrowing less than 50% bilaterally by ultrasound criteria.    EKG:  No new tracing today  Recent Labs: 01/29/2021: TSH 3.67  Recent Lipid Panel No results found for: CHOL, TRIG, HDL, CHOLHDL, VLDL, LDLCALC, LDLDIRECT         Physical Exam:    VS:  BP (!) 160/86    Pulse (!) 58    Ht 6\' 1"  (1.854 m)    Wt 216 lb 9.6 oz (98.2 kg)    SpO2 98%    BMI 28.58 kg/m     Wt Readings from Last 3 Encounters:  02/23/21 216 lb 9.6 oz (98.2 kg)  01/29/21 213 lb (96.6 kg)  12/04/20 214 lb 3.2 oz (97.2 kg)     GEN:  Well nourished, well developed in no acute distress HEENT: Normal NECK: No JVD; No carotid bruits CARDIAC: Bradycardic, regular, no murmurs, rubs, gallops RESPIRATORY:  Clear to auscultation without rales, wheezing or rhonchi  ABDOMEN: Soft,  non-tender, non-distended MUSCULOSKELETAL:  No edema; No deformity  SKIN: Warm and dry NEUROLOGIC:  Alert and oriented x 3 PSYCHIATRIC:  Normal affect   ASSESSMENT:    1. HFrEF (heart failure with reduced ejection fraction) ([place])   2. Coronary artery disease involving native heart, unspecified vessel or lesion type, unspecified whether angina present   3. Hypertension, unspecified type   4. Left bundle branch block   5. Pure hypercholesterolemia   6. Frequent headaches    PLAN:    In order of problems listed above:  #Newly Diagnosed Systolic Heart Failure with Mildly Reduced EF: EF 40% with inferior and inferoseptal hypokinesis. Currently euvolemic and compensated. Has a remote history of cath in his 56s which reportedly showed "weakness in the inferior part of the heart," but no recent ischemic work-up. He is overall asymptomatic and very active without anginal symptoms. Will start with myoview to assess for ischemia given reduced EF and if abnormal, can consider cath at this time. -Check myoview to assess for ischemia given reduced EF and WMA on TTE; if no reversible defect, likely can be managed medically as patient is asymptomatic and would prefer to avoid invasive procedures if possible -Continue ASA 81mg  daily -Continue metop 25mg  XL daily -Continue lipitor 20mg  daily -Change lisinopril to entreso 24/26mg  BID  #LBBB: -No evidence of high degree AVB on cardiac monitor -Check myoview as above  #HTN: -Change lisinopril to entreso 25/26mg  BID -Continue metop 25mg  XL daily  #HLD: -Continue lipitor 20mg  daily  #HA: -Improved with BP medications -Carotids with mild disease     Shared Decision Making/Informed Consent The risks [chest pain, shortness of breath, cardiac arrhythmias, dizziness, blood pressure fluctuations, myocardial infarction, stroke/transient ischemic attack, nausea, vomiting, allergic reaction, radiation exposure, metallic taste sensation and  life-threatening complications (estimated to be 1 in 10,000)], benefits (risk stratification, diagnosing coronary artery disease, treatment guidance) and alternatives of a nuclear stress test were discussed in detail with Mr. Oommen and he agrees to proceed.    Medication Adjustments/Labs and Tests Ordered: Current medicines are reviewed at length with the patient today.  Concerns regarding medicines are outlined above.  Orders Placed This Encounter  Procedures   NM Myocar Multi W/Spect W/Wall Motion / EF   Cardiac Stress Test:  Informed Consent Details: Physician/Practitioner Attestation; Transcribe to consent form and obtain patient signature   Meds ordered this encounter  Medications   sacubitril-valsartan (ENTRESTO) 24-26 MG    Sig: Take 1 tablet by mouth 2 (two) times daily.    Dispense:  60 tablet    Refill:  5    Please Honor Card patient is presenting for Carmie Kanner: [id number]; Juanna Cao: [phone]; XIHWT: OHS; UUEK: [phone]    Patient Instructions  Medication Instructions:  STOP Lisinopril  START Entresto 24-26 mg tablets daily *If you need a refill on your cardiac medications before your next appointment, please call your pharmacy*   Testing/Procedures: Myoview Exercise   Follow-Up: At [facility], you and your health needs are our priority.  As part of our continuing mission to provide you with exceptional heart care, we have created designated Provider Care Teams.  These Care Teams include your primary Cardiologist (physician) and Advanced Practice Providers (APPs -  Physician Assistants and Nurse Practitioners) who all work together to provide you with the care you need, when you need it.  We recommend signing up for the patient portal called "MyChart".  Sign up information is provided on this After Visit Summary.  MyChart is used to connect with patients for Virtual Visits (Telemedicine).  Patients are able to view lab/test results, encounter notes, upcoming  appointments, etc.  Non-urgent messages can be sent to your provider as well.   To learn more about what you can do with MyChart, go to [email or web address].    Your next appointment:   3 month(s)  The format for your next appointment:   In Person  Provider:   Carlyle Dolly, MD    Other Instructions      Signed, Freada Bergeron, MD  02/23/2021 2:49 PM    [place]

## 2021-02-23 ENCOUNTER — Ambulatory Visit (INDEPENDENT_AMBULATORY_CARE_PROVIDER_SITE_OTHER): Payer: Medicare Other | Admitting: Cardiology

## 2021-02-23 ENCOUNTER — Encounter: Payer: Self-pay | Admitting: Cardiology

## 2021-02-23 ENCOUNTER — Ambulatory Visit (HOSPITAL_COMMUNITY)
Admission: RE | Admit: 2021-02-23 | Discharge: 2021-02-23 | Disposition: A | Discharge status: Home / Self Care | Payer: Medicare Other | Source: Ambulatory Visit | Attending: Physician Assistant | Admitting: Physician Assistant

## 2021-02-23 ENCOUNTER — Other Ambulatory Visit: Payer: Self-pay

## 2021-02-23 VITALS — BP 160/86 | HR 58 | Ht 73.0 in | Wt 216.6 lb

## 2021-02-23 DIAGNOSIS — R413 Other amnesia: Secondary | ICD-10-CM | POA: Insufficient documentation

## 2021-02-23 DIAGNOSIS — I2510 Atherosclerotic heart disease of native coronary artery without angina pectoris: Secondary | ICD-10-CM | POA: Diagnosis not present

## 2021-02-23 DIAGNOSIS — I10 Essential (primary) hypertension: Secondary | ICD-10-CM | POA: Diagnosis not present

## 2021-02-23 DIAGNOSIS — G319 Degenerative disease of nervous system, unspecified: Secondary | ICD-10-CM | POA: Diagnosis not present

## 2021-02-23 DIAGNOSIS — E7800 Pure hypercholesterolemia, unspecified: Secondary | ICD-10-CM

## 2021-02-23 DIAGNOSIS — I5020 Unspecified systolic (congestive) heart failure: Secondary | ICD-10-CM | POA: Diagnosis not present

## 2021-02-23 DIAGNOSIS — I447 Left bundle-branch block, unspecified: Secondary | ICD-10-CM

## 2021-02-23 DIAGNOSIS — R519 Headache, unspecified: Secondary | ICD-10-CM

## 2021-02-23 MED ORDER — SACUBITRIL-VALSARTAN 24-26 MG PO TABS: 1.0000 | ORAL_TABLET | Freq: Two times a day (BID) | ORAL | 5 refills | Status: DC

## 2021-02-23 NOTE — Patient Instructions (Signed)
Medication Instructions:  STOP Lisinopril  START Entresto 24-26 mg tablets daily *If you need a refill on your cardiac medications before your next appointment, please call your pharmacy*   Testing/Procedures: Myoview Exercise   Follow-Up: At [facility], you and your health needs are our priority.  As part of our continuing mission to provide you with exceptional heart care, we have created designated Provider Care Teams.  These Care Teams include your primary Cardiologist (physician) and Advanced Practice Providers (APPs -  Physician Assistants and Nurse Practitioners) who all work together to provide you with the care you need, when you need it.  We recommend signing up for the patient portal called "MyChart".  Sign up information is provided on this After Visit Summary.  MyChart is used to connect with patients for Virtual Visits (Telemedicine).  Patients are able to view lab/test results, encounter notes, upcoming appointments, etc.  Non-urgent messages can be sent to your provider as well.   To learn more about what you can do with MyChart, go to [email or web address].    Your next appointment:   3 month(s)  The format for your next appointment:   In Person  Provider:   Carlyle Dolly, MD    Other Instructions

## 2021-02-25 ENCOUNTER — Telehealth: Payer: Self-pay | Admitting: Cardiology

## 2021-02-25 DIAGNOSIS — I5022 Chronic systolic (congestive) heart failure: Secondary | ICD-10-CM | POA: Diagnosis not present

## 2021-02-25 DIAGNOSIS — M6281 Muscle weakness (generalized): Secondary | ICD-10-CM | POA: Diagnosis not present

## 2021-02-25 DIAGNOSIS — E291 Testicular hypofunction: Secondary | ICD-10-CM | POA: Diagnosis not present

## 2021-02-25 DIAGNOSIS — E559 Vitamin D deficiency, unspecified: Secondary | ICD-10-CM | POA: Diagnosis not present

## 2021-02-25 DIAGNOSIS — F5221 Male erectile disorder: Secondary | ICD-10-CM | POA: Diagnosis not present

## 2021-02-25 DIAGNOSIS — R7301 Impaired fasting glucose: Secondary | ICD-10-CM | POA: Diagnosis not present

## 2021-02-25 DIAGNOSIS — N182 Chronic kidney disease, stage 2 (mild): Secondary | ICD-10-CM | POA: Diagnosis not present

## 2021-02-25 DIAGNOSIS — R253 Fasciculation: Secondary | ICD-10-CM | POA: Diagnosis not present

## 2021-02-25 DIAGNOSIS — K219 Gastro-esophageal reflux disease without esophagitis: Secondary | ICD-10-CM | POA: Diagnosis not present

## 2021-02-25 DIAGNOSIS — R17 Unspecified jaundice: Secondary | ICD-10-CM | POA: Diagnosis not present

## 2021-02-25 DIAGNOSIS — H04123 Dry eye syndrome of bilateral lacrimal glands: Secondary | ICD-10-CM | POA: Diagnosis not present

## 2021-02-25 DIAGNOSIS — E782 Mixed hyperlipidemia: Secondary | ICD-10-CM | POA: Diagnosis not present

## 2021-02-25 NOTE — Telephone Encounter (Signed)
Pt c/o medication issue:  1. Name of Medication: sacubitril-valsartan (ENTRESTO) 24-26 MG  2. How are you currently taking this medication (dosage and times per day)? Has not started  3. Are you having a reaction (difficulty breathing--STAT)? no  4. What is your medication issue? Patient's wife states she was told it may take time for him to get his entresto. She is calling back to follow up in case she needs to do anything.

## 2021-02-25 NOTE — Telephone Encounter (Signed)
Spoke to pt's wife and verbalized that free 30 day trial offer card was attached to prescription that was e-scribed to pharmacy. Also told pt's wife about Andrew Mcgrath pt assistance foundation as it will cost pt over $500 to fill medication. Pt asked to have application mailed.

## 2021-02-27 ENCOUNTER — Ambulatory Visit (HOSPITAL_COMMUNITY)
Admission: RE | Admit: 2021-02-27 | Discharge: 2021-02-27 | Disposition: A | Discharge status: Home / Self Care | Payer: Medicare Other | Source: Ambulatory Visit | Attending: Cardiology | Admitting: Cardiology

## 2021-02-27 ENCOUNTER — Encounter (HOSPITAL_COMMUNITY): Payer: Self-pay

## 2021-02-27 ENCOUNTER — Other Ambulatory Visit: Payer: Self-pay

## 2021-02-27 ENCOUNTER — Encounter (HOSPITAL_COMMUNITY)
Admission: RE | Admit: 2021-02-27 | Discharge: 2021-02-27 | Disposition: A | Discharge status: Home / Self Care | Payer: Medicare Other | Source: Ambulatory Visit | Attending: Cardiology | Admitting: Cardiology

## 2021-02-27 DIAGNOSIS — I5020 Unspecified systolic (congestive) heart failure: Secondary | ICD-10-CM

## 2021-02-27 DIAGNOSIS — I2510 Atherosclerotic heart disease of native coronary artery without angina pectoris: Secondary | ICD-10-CM | POA: Insufficient documentation

## 2021-02-27 LAB — NM MYOCAR MULTI W/SPECT W/WALL MOTION / EF
LV dias vol: 153 mL (ref 62–150)
LV sys vol: 77 mL
Nuc Stress EF: 50 %
Peak HR: 73 {beats}/min
RATE: 0.4
Rest HR: 57 {beats}/min
Rest Nuclear Isotope Dose: 11 mCi
SDS: 1
SRS: 13
SSS: 14
Stress Nuclear Isotope Dose: 31 mCi
TID: 1.04

## 2021-02-27 MED ORDER — REGADENOSON 0.4 MG/5ML IV SOLN
INTRAVENOUS | Status: AC
  Administered 2021-02-27: 0.4 mg via INTRAVENOUS
  Filled 2021-02-27: qty 5

## 2021-02-27 MED ORDER — SODIUM CHLORIDE FLUSH 0.9 % IV SOLN
INTRAVENOUS | Status: AC
  Administered 2021-02-27: 10 mL via INTRAVENOUS
  Filled 2021-02-27: qty 10

## 2021-02-27 MED ORDER — TECHNETIUM TC 99M TETROFOSMIN IV KIT
30.0000 | PACK | Freq: Once | INTRAVENOUS | Status: AC | PRN
  Administered 2021-02-27: 31 via INTRAVENOUS

## 2021-02-27 MED ORDER — TECHNETIUM TC 99M TETROFOSMIN IV KIT
10.0000 | PACK | Freq: Once | INTRAVENOUS | Status: AC | PRN
  Administered 2021-02-27: 11 via INTRAVENOUS

## 2021-05-13 ENCOUNTER — Encounter: Payer: Self-pay | Admitting: Cardiology

## 2021-05-18 NOTE — Telephone Encounter (Signed)
If patient not having lightheadedness or dizziness would accept these bp's. Andrew Mcgrath is very important medicine to help strenthen his heart. If symptoms of dizziness/lightheadedness or top bp number was in the 80s then we would stop the medication. Have him update [place] please ? ?JBranch MD ?

## 2021-06-11 ENCOUNTER — Ambulatory Visit (INDEPENDENT_AMBULATORY_CARE_PROVIDER_SITE_OTHER): Payer: Medicare Other | Admitting: Cardiology

## 2021-06-11 ENCOUNTER — Encounter: Payer: Self-pay | Admitting: Cardiology

## 2021-06-11 VITALS — BP 110/68 | HR 65 | Ht 73.0 in | Wt 200.4 lb

## 2021-06-11 DIAGNOSIS — I2510 Atherosclerotic heart disease of native coronary artery without angina pectoris: Secondary | ICD-10-CM | POA: Diagnosis not present

## 2021-06-11 DIAGNOSIS — I10 Essential (primary) hypertension: Secondary | ICD-10-CM | POA: Diagnosis not present

## 2021-06-11 DIAGNOSIS — I5022 Chronic systolic (congestive) heart failure: Secondary | ICD-10-CM

## 2021-06-11 NOTE — Patient Instructions (Addendum)
Medication Instructions:  Your physician recommends that you continue on your current medications as directed. Please refer to the Current Medication list given to you today.   Labwork: None  Testing/Procedures: Your physician has requested that you have an echocardiogram. Echocardiography is a painless test that uses sound waves to create images of your heart. It provides your doctor with information about the size and shape of your heart and how well your heart's chambers and valves are working. This procedure takes approximately one hour. There are no restrictions for this procedure.   Follow-Up: Follow up with Dr. Harl Bowie in 4 months  Any Other Special Instructions Will Be Listed Below (If Applicable).     If you need a refill on your cardiac medications before your next appointment, please call your pharmacy.

## 2021-06-11 NOTE — Progress Notes (Signed)
Clinical Summary Mr. Nielson is a 81 y.o.male   1.Chronic systolic HF - 47/4259 echo: LVEF 40%,  - 02/2021 nuclear stress: no ischemia. Scar vs artifact in the inferior/inferoseptal walls.  - from prior clinic note prefers to avoid invasive procedures if possible - denies SOB/DOE, no recent LE edema. Walks 2-3 miles daily.  - compliant with meds -home bps 100-121/50-60   HRs 50s to 60s.  - entresto costs $1100 a year but for now manageable for them.      2.Chronic LBBB   3.HTN - compliant with meds   4. Hyperlipidemia Jan 2023 TC 142 TG 126 HDL 43 LDL 77    Past Medical History:  Diagnosis Date   Bradycardia    Familial restless leg syndrome    GERD (gastroesophageal reflux disease)    Hyperlipidemia      Allergies  Allergen Reactions   Bee Venom Swelling     Current Outpatient Medications  Medication Sig Dispense Refill   aspirin EC 81 MG tablet Take 1 tablet (81 mg total) by mouth daily. 30 tablet 11   atorvastatin (LIPITOR) 20 MG tablet Take 1 tablet (20 mg total) by mouth daily. 90 tablet 3   cetirizine (ZYRTEC) 10 MG tablet Take 10 mg by mouth daily as needed for allergies.     diclofenac Sodium (VOLTAREN) 1 % GEL Apply 1 application topically daily as needed (pain).     EPINEPHrine 0.3 mg/0.3 mL IJ SOAJ injection Inject 0.3 mg into the muscle as needed for anaphylaxis (Bee stings).     famotidine (PEPCID) 40 MG tablet Take 40 mg by mouth daily.     fluticasone (FLONASE) 50 MCG/ACT nasal spray Place 1 spray into both nostrils daily as needed for allergies or rhinitis.     methylPREDNISolone (MEDROL DOSEPAK) 4 MG TBPK tablet Take by mouth. (Patient not taking: Reported on 02/23/2021)     metoprolol succinate (TOPROL XL) 25 MG 24 hr tablet Take 1 tablet (25 mg total) by mouth daily. 90 tablet 3   Multiple Vitamins-Minerals (MULTIVITAMIN WITH MINERALS) tablet Take 1 tablet by mouth daily.     penicillin v potassium (VEETID) 500 MG tablet Take 500 mg by  mouth 2 (two) times daily. (Patient not taking: Reported on 02/23/2021)     Polyethyl Glycol-Propyl Glycol 0.4-0.3 % SOLN Place 1-2 drops into both eyes daily as needed (dry eyes).     polyethylene glycol-electrolytes (TRILYTE) 420 g solution Take 4,000 mLs by mouth as directed. 4000 mL 0   sacubitril-valsartan (ENTRESTO) 24-26 MG Take 1 tablet by mouth 2 (two) times daily. 60 tablet 5   testosterone cypionate (DEPOTESTOSTERONE CYPIONATE) 200 MG/ML injection Inject 200 mg into the muscle every 21 ( twenty-one) days.     No current facility-administered medications for this visit.     Past Surgical History:  Procedure Laterality Date   APPENDECTOMY     CARDIAC CATHETERIZATION     COLONOSCOPY     COLONOSCOPY N/A 08/23/2013   Procedure: COLONOSCOPY;  Surgeon: Rogene Houston, MD;  [facility];  Service: Endoscopy;  Laterality: N/A;  1200   COLONOSCOPY N/A 11/06/2020   Procedure: COLONOSCOPY;  Surgeon: Rogene Houston, MD;  [facility];  Service: Endoscopy;  Laterality: N/A;  10:30   POLYPECTOMY  11/06/2020   Procedure: POLYPECTOMY;  Surgeon: Rogene Houston, MD;  [facility];  Service: Endoscopy;;   TONSILLECTOMY       Allergies  Allergen Reactions  Bee Venom Swelling      Family History  Problem Relation Age of Onset   Colon cancer Neg Hx      Social History Mr. Roudebush reports that he has quit smoking. His smoking use included cigarettes. He has a 30.00 pack-year smoking history. He has never used smokeless tobacco. Mr. Danese reports current alcohol use.   Review of Systems CONSTITUTIONAL: No weight loss, fever, chills, weakness or fatigue.  HEENT: Eyes: No visual loss, blurred vision, double vision or yellow sclerae.No hearing loss, sneezing, congestion, runny nose or sore throat.  SKIN: No rash or itching.  CARDIOVASCULAR: per hpi RESPIRATORY: No shortness of breath, cough or sputum.  GASTROINTESTINAL: No anorexia, nausea,  vomiting or diarrhea. No abdominal pain or blood.  GENITOURINARY: No burning on urination, no polyuria NEUROLOGICAL: No headache, dizziness, syncope, paralysis, ataxia, numbness or tingling in the extremities. No change in bowel or bladder control.  MUSCULOSKELETAL: No muscle, back pain, joint pain or stiffness.  LYMPHATICS: No enlarged nodes. No history of splenectomy.  PSYCHIATRIC: No history of depression or anxiety.  ENDOCRINOLOGIC: No reports of sweating, cold or heat intolerance. No polyuria or polydipsia.  Marland Kitchen   Physical Examination Today's Vitals   06/11/21 0840  BP: 110/68  Pulse: 65  SpO2: 99%  Weight: 200 lb 6.4 oz (90.9 kg)  Height: '6\' 1"'$  (1.854 m)   Body mass index is 26.44 kg/m.  Gen: resting comfortably, no acute distress HEENT: no scleral icterus, pupils equal round and reactive, no palptable cervical adenopathy,  CV: RRR, no m/r/g, no jvd Resp: Clear to auscultation bilaterally GI: abdomen is soft, non-tender, non-distended, normal bowel sounds, no hepatosplenomegaly MSK: extremities are warm, no edema.  Skin: warm, no rash Neuro:  no focal deficits Psych: appropriate affect   Diagnostic Studies  12/2020 echo 1. Difficult apical windows Images are foreshortened in some views  Overall LVEF is depressed with global hypokinesis, worse in the inferior,  inferoseptal walls.. Left ventricular ejection fraction, by estimation, is  40%%. The left ventricle has  moderately decreased function. The left ventricle demonstrates global  hypokinesis. Indeterminate diastolic filling due to E-A fusion.   2. Right ventricular systolic function is low normal. The right  ventricular size is normal.   3. Left atrial size was moderately dilated.   4. The mitral valve is normal in structure. No evidence of mitral valve  regurgitation.   5. The aortic valve is tricuspid. Aortic valve regurgitation is not  visualized.   6. The inferior vena cava is normal in size with greater  than 50%  respiratory variability, suggesting right atrial pressure of 3 mmHg.   02/2021 nuclear stress  Lexiscan stress is electrically nondiagnostic due to LBBB   Myoview scan with a fixed defect in the inferior (base, mid, distal), inferoseptal (base, mid) wall consistent with scar and possible soft tissue attenuation (diaphragm)   No ischemia   LVEF on gating is 50% with mild global hypokinesis   Overall low risk scan    Assessment and Plan  1.Chronic systolic HF - euvolemic today, no current symptoms - repeat echo, if ongoing dysfunction consider additional med changes - low HRs, no room to titrate toprol. Some mild orthostatic symptoms would not titrate entresto. Could consider low dose aldactone, SGLT2i if ongoing LV dysfunction   2.HTN - at goal, continue current meds  F/u 4 months   Arnoldo Lenis, M.D.

## 2021-06-15 ENCOUNTER — Ambulatory Visit (INDEPENDENT_AMBULATORY_CARE_PROVIDER_SITE_OTHER): Payer: Medicare Other

## 2021-06-15 DIAGNOSIS — I5022 Chronic systolic (congestive) heart failure: Secondary | ICD-10-CM

## 2021-06-15 LAB — ECHOCARDIOGRAM COMPLETE
AR max vel: 2.24 cm2
AV Area VTI: 2.59 cm2
AV Area mean vel: 2.64 cm2
AV Mean grad: 3.3 mmHg
AV Peak grad: 8 mmHg
Ao pk vel: 1.41 m/s
Area-P 1/2: 1.81 cm2
Calc EF: 55.4 %
MV M vel: 1.97 m/s
MV Peak grad: 15.4 mmHg
S' Lateral: 3.19 cm
Single Plane A2C EF: 54.2 %
Single Plane A4C EF: 54.1 %

## 2021-06-23 ENCOUNTER — Telehealth: Payer: Self-pay | Admitting: *Deleted

## 2021-06-23 NOTE — Telephone Encounter (Signed)
-----   Message from Arnoldo Lenis, MD sent at 06/23/2021  3:48 PM EDT ----- Heart pumping function has improved back into the normal range, we do not need to make any additional mediation changes at this time.   Zandra Abts MD

## 2021-06-23 NOTE — Telephone Encounter (Signed)
Laurine Blazer, LPN  1/49/7026  3:78 PM EDT Back to Top    Notified (wife) Edwena Felty, copy to pcp.

## 2021-07-10 DIAGNOSIS — Z961 Presence of intraocular lens: Secondary | ICD-10-CM | POA: Diagnosis not present

## 2021-07-10 DIAGNOSIS — H04123 Dry eye syndrome of bilateral lacrimal glands: Secondary | ICD-10-CM | POA: Diagnosis not present

## 2021-07-10 DIAGNOSIS — H1045 Other chronic allergic conjunctivitis: Secondary | ICD-10-CM | POA: Diagnosis not present

## 2021-07-10 DIAGNOSIS — H0102B Squamous blepharitis left eye, upper and lower eyelids: Secondary | ICD-10-CM | POA: Diagnosis not present

## 2021-07-10 DIAGNOSIS — H0102A Squamous blepharitis right eye, upper and lower eyelids: Secondary | ICD-10-CM | POA: Diagnosis not present

## 2021-07-10 DIAGNOSIS — H26493 Other secondary cataract, bilateral: Secondary | ICD-10-CM | POA: Diagnosis not present

## 2021-07-24 ENCOUNTER — Encounter: Payer: Medicare Other | Admitting: Psychology

## 2021-07-29 ENCOUNTER — Encounter: Payer: Self-pay | Admitting: Cardiology

## 2021-07-30 ENCOUNTER — Emergency Department (HOSPITAL_COMMUNITY): Payer: Medicare Other

## 2021-07-30 ENCOUNTER — Emergency Department (HOSPITAL_COMMUNITY)
Admission: EM | Admit: 2021-07-30 | Discharge: 2021-07-30 | Disposition: A | Discharge status: Home / Self Care | Payer: Medicare Other | Attending: Emergency Medicine | Admitting: Emergency Medicine

## 2021-07-30 ENCOUNTER — Encounter (HOSPITAL_COMMUNITY): Payer: Self-pay

## 2021-07-30 ENCOUNTER — Other Ambulatory Visit: Payer: Self-pay

## 2021-07-30 DIAGNOSIS — T7840XA Allergy, unspecified, initial encounter: Secondary | ICD-10-CM | POA: Diagnosis not present

## 2021-07-30 DIAGNOSIS — I2510 Atherosclerotic heart disease of native coronary artery without angina pectoris: Secondary | ICD-10-CM | POA: Diagnosis not present

## 2021-07-30 DIAGNOSIS — Z7982 Long term (current) use of aspirin: Secondary | ICD-10-CM | POA: Insufficient documentation

## 2021-07-30 DIAGNOSIS — R079 Chest pain, unspecified: Secondary | ICD-10-CM | POA: Diagnosis not present

## 2021-07-30 DIAGNOSIS — I959 Hypotension, unspecified: Secondary | ICD-10-CM | POA: Diagnosis not present

## 2021-07-30 DIAGNOSIS — R0789 Other chest pain: Secondary | ICD-10-CM | POA: Diagnosis not present

## 2021-07-30 DIAGNOSIS — R0602 Shortness of breath: Secondary | ICD-10-CM | POA: Diagnosis not present

## 2021-07-30 DIAGNOSIS — R231 Pallor: Secondary | ICD-10-CM | POA: Diagnosis not present

## 2021-07-30 LAB — CBC WITH DIFFERENTIAL/PLATELET
Abs Immature Granulocytes: 0.01 10*3/uL (ref 0.00–0.07)
Basophils Absolute: 0 10*3/uL (ref 0.0–0.1)
Basophils Relative: 1 %
Eosinophils Absolute: 0.1 10*3/uL (ref 0.0–0.5)
Eosinophils Relative: 2 %
HCT: 45.2 % (ref 39.0–52.0)
Hemoglobin: 15.8 g/dL (ref 13.0–17.0)
Immature Granulocytes: 0 %
Lymphocytes Relative: 40 %
Lymphs Abs: 1.9 10*3/uL (ref 0.7–4.0)
MCH: 31.4 pg (ref 26.0–34.0)
MCHC: 35 g/dL (ref 30.0–36.0)
MCV: 89.9 fL (ref 80.0–100.0)
Monocytes Absolute: 0.2 10*3/uL (ref 0.1–1.0)
Monocytes Relative: 4 %
Neutro Abs: 2.5 10*3/uL (ref 1.7–7.7)
Neutrophils Relative %: 53 %
Platelets: 150 10*3/uL (ref 150–400)
RBC: 5.03 MIL/uL (ref 4.22–5.81)
RDW: 13.3 % (ref 11.5–15.5)
WBC: 4.8 10*3/uL (ref 4.0–10.5)
nRBC: 0 % (ref 0.0–0.2)

## 2021-07-30 LAB — COMPREHENSIVE METABOLIC PANEL
ALT: 23 U/L (ref 0–44)
AST: 23 U/L (ref 15–41)
Albumin: 3.8 g/dL (ref 3.5–5.0)
Alkaline Phosphatase: 51 U/L (ref 38–126)
Anion gap: 6 (ref 5–15)
BUN: 14 mg/dL (ref 8–23)
CO2: 24 mmol/L (ref 22–32)
Calcium: 9 mg/dL (ref 8.9–10.3)
Chloride: 106 mmol/L (ref 98–111)
Creatinine, Ser: 1.14 mg/dL (ref 0.61–1.24)
GFR, Estimated: >60 mL/min (ref >60)
Glucose, Bld: 184 mg/dL — ABNORMAL HIGH (ref 70–99)
Potassium: 3.8 mmol/L (ref 3.5–5.1)
Sodium: 136 mmol/L (ref 135–145)
Total Bilirubin: 1.9 mg/dL — ABNORMAL HIGH (ref 0.3–1.2)
Total Protein: 6.1 g/dL — ABNORMAL LOW (ref 6.5–8.1)

## 2021-07-30 LAB — TROPONIN I (HIGH SENSITIVITY)
Troponin I (High Sensitivity): 5 ng/L (ref <18)
Troponin I (High Sensitivity): 4 ng/L (ref <18)

## 2021-07-30 MED ORDER — SODIUM CHLORIDE 0.9 % IV BOLUS
1000.0000 mL | Freq: Once | INTRAVENOUS | Status: AC
  Administered 2021-07-30: 1000 mL via INTRAVENOUS

## 2021-07-30 MED ORDER — METHYLPREDNISOLONE SODIUM SUCC 125 MG IJ SOLR
125.0000 mg | Freq: Once | INTRAMUSCULAR | Status: AC
  Administered 2021-07-30: 125 mg via INTRAVENOUS
  Filled 2021-07-30: qty 2

## 2021-07-30 MED ORDER — FAMOTIDINE IN NACL 20-0.9 MG/50ML-% IV SOLN
20.0000 mg | Freq: Once | INTRAVENOUS | Status: AC
  Administered 2021-07-30: 20 mg via INTRAVENOUS
  Filled 2021-07-30: qty 50

## 2021-07-30 MED ORDER — PREDNISONE 20 MG PO TABS: ORAL_TABLET | ORAL | 0 refills | Status: DC

## 2021-07-30 NOTE — ED Triage Notes (Addendum)
Pt to ED  c/o chest pain after being stung by a bee at 730. Pt is allergic to bees. Did not use epi pen. LSC. 324 aspirin 0.4 nitro x2 given by EMS with relief.

## 2021-07-30 NOTE — Telephone Encounter (Signed)
Sorry to hear about his experience. I think the chest pain was related to the bee sting, sometimes allregic reaction can cause tightening of the airways which can cause some pain. He had a normal stress test just 5 months ago suggesting a new blockage would be unlikely. Would use losartan '25mg'$  daily as opposed to lisinopril once entresto has run out  Zandra Abts MD

## 2021-07-30 NOTE — ED Provider Notes (Signed)
[facility] EMERGENCY DEPARTMENT Provider Note   CSN: [id number] Arrival date & time: 07/30/21  1607     History  Chief Complaint  Patient presents with   Chest Pain    Andrew Mcgrath is a 81 y.o. male.  Patient was stung by a bee in the right ear.  He was given Benadryl by his wife.  Patient had some chest discomfort also.  He has a history of coronary artery disease.  Patient was given nitro x2 with relief  The history is provided by the patient and medical records. No language interpreter was used.  Chest Pain Pain location:  L chest Pain quality: aching   Pain radiates to:  Does not radiate Pain severity:  Mild Onset quality:  Sudden Timing:  Intermittent Progression:  Resolved Chronicity:  New Associated symptoms: no abdominal pain, no back pain, no cough, no fatigue and no headache        Home Medications Prior to Admission medications   Medication Sig Start Date End Date Taking? Authorizing Provider  aspirin EC 81 MG tablet Take 1 tablet (81 mg total) by mouth daily. 11/09/20  Yes Rehman, Mechele Dawley, MD  atorvastatin (LIPITOR) 20 MG tablet Take 1 tablet (20 mg total) by mouth daily. 12/17/20 07/30/21 Yes Jerline Pain, MD  cetirizine (ZYRTEC) 10 MG tablet Take 10 mg by mouth daily as needed for allergies.   Yes [provider]  diclofenac Sodium (VOLTAREN) 1 % GEL Apply 1 application topically daily as needed (pain).   Yes [provider]  diphenhydrAMINE (BENADRYL) 25 MG tablet Take 50 mg by mouth every 6 (six) hours as needed for itching or allergies.   Yes [provider]  famotidine (PEPCID) 40 MG tablet Take 40 mg by mouth daily.   Yes [provider]  fluticasone (FLONASE) 50 MCG/ACT nasal spray Place 1 spray into both nostrils daily as needed for allergies or rhinitis.   Yes [provider]  metoprolol succinate (TOPROL XL) 25 MG 24 hr tablet Take 1 tablet (25 mg total) by mouth daily. 01/21/21  Yes Jerline Pain,  MD  Multiple Vitamins-Minerals (MULTIVITAMIN WITH MINERALS) tablet Take 1 tablet by mouth daily.   Yes [provider]  Polyethyl Glycol-Propyl Glycol 0.4-0.3 % SOLN Place 1-2 drops into both eyes daily as needed (dry eyes).   Yes [provider]  predniSONE (DELTASONE) 20 MG tablet 2 tabs po daily x 3 days 07/30/21  Yes Milton Ferguson, MD  sacubitril-valsartan (ENTRESTO) 24-26 MG Take 1 tablet by mouth 2 (two) times daily. 02/23/21  Yes Freada Bergeron, MD  sildenafil (VIAGRA) 100 MG tablet Take 1 tablet by mouth as needed for erectile dysfunction. 07/10/21  Yes [provider]  triamcinolone cream (KENALOG) 0.1 % Apply 1 Application topically daily as needed (skin irritation). 03/14/19  Yes [provider]  EPINEPHrine 0.3 mg/0.3 mL IJ SOAJ injection Inject 0.3 mg into the muscle as needed for anaphylaxis (Bee stings).    [provider]  testosterone cypionate (DEPOTESTOSTERONE CYPIONATE) 200 MG/ML injection Inject 200 mg into the muscle every 21 ( twenty-one) days. Patient not taking: Reported on 07/30/2021    [provider]      Allergies    Bee venom and Penicillins    Review of Systems   Review of Systems  Constitutional:  Negative for appetite change and fatigue.  HENT:  Negative for congestion, ear discharge and sinus pressure.   Eyes:  Negative for discharge.  Respiratory:  Negative for cough.   Cardiovascular:  Positive for chest pain.  Gastrointestinal:  Negative for abdominal pain and diarrhea.  Genitourinary:  Negative for frequency and hematuria.  Musculoskeletal:  Negative for back pain.  Skin:  Negative for rash.  Neurological:  Negative for seizures and headaches.  Psychiatric/Behavioral:  Negative for hallucinations.     Physical Exam Updated Vital Signs BP 113/65   Pulse (!) 55   Temp 98.1 F (36.7 C)   Resp 15   Ht '6\' 1"'$  (1.854 m)   Wt 90 kg   SpO2 97%   BMI 26.18 kg/m  Physical Exam Vitals reviewed.   Constitutional:      Appearance: He is well-developed.  HENT:     Head: Normocephalic.     Comments: Swelling to right ear where bee sting occurred    Nose: Nose normal.  Eyes:     General: No scleral icterus.    Conjunctiva/sclera: Conjunctivae normal.  Neck:     Thyroid: No thyromegaly.  Cardiovascular:     Rate and Rhythm: Normal rate and regular rhythm.     Heart sounds: No murmur heard.    No friction rub. No gallop.  Pulmonary:     Breath sounds: No stridor. No wheezing or rales.  Chest:     Chest wall: No tenderness.  Abdominal:     General: There is no distension.     Tenderness: There is no abdominal tenderness. There is no rebound.  Musculoskeletal:        General: Normal range of motion.     Cervical back: Neck supple.  Lymphadenopathy:     Cervical: No cervical adenopathy.  Skin:    Findings: No erythema or rash.  Neurological:     Mental Status: He is alert and oriented to person, place, and time.     Motor: No abnormal muscle tone.     Coordination: Coordination normal.  Psychiatric:        Behavior: Behavior normal.     ED Results / Procedures / Treatments   Labs (all labs ordered are listed, but only abnormal results are displayed) Labs Reviewed  COMPREHENSIVE METABOLIC PANEL - Abnormal; Notable for the following components:      Result Value   Glucose, Bld 184 (*)    Total Protein 6.1 (*)    Total Bilirubin 1.9 (*)    All other components within normal limits  CBC WITH DIFFERENTIAL/PLATELET  TROPONIN I (HIGH SENSITIVITY)  TROPONIN I (HIGH SENSITIVITY)    EKG EKG Interpretation  Date/Time:  Thursday July 30 2021 08:27:32 EDT Ventricular Rate:  65 PR Interval:  231 QRS Duration: 164 QT Interval:  478 QTC Calculation: 498 R Axis:   -36 Text Interpretation: Sinus rhythm Prolonged PR interval Left bundle branch block Confirmed by Milton Ferguson [phone]) on 07/30/2021 8:36:18 AM  Radiology DG Chest Port 1 View  Result Date:  07/30/2021 CLINICAL DATA:  Shortness of breath. EXAM: PORTABLE CHEST 1 VIEW COMPARISON:  None Available. FINDINGS: The heart size and mediastinal contours are within normal limits. Both lungs are clear. The visualized skeletal structures are unremarkable. IMPRESSION: No active disease. Electronically Signed   By: Keane Police D.O.   On: 07/30/2021 09:11    Procedures Procedures    Medications Ordered in ED Medications  sodium chloride 0.9 % bolus 1,000 mL (0 mLs Intravenous Stopped 07/30/21 1041)  methylPREDNISolone sodium succinate (SOLU-MEDROL) 125 mg/2 mL injection 125 mg (125 mg Intravenous Given 07/30/21 0902)  famotidine (PEPCID) IVPB 20  mg premix (0 mg Intravenous Stopped 07/30/21 1041)    ED Course/ Medical Decision Making/ A&P                           Medical Decision Making Amount and/or Complexity of Data Reviewed Labs: ordered. Radiology: ordered.  Risk Prescription drug management.   This patient presents to the ED for concern of chest pain obesity, this involves an extensive number of treatment options, and is a complaint that carries with it a high risk of complications and morbidity.  The differential diagnosis includes allergic reaction, angina   Co morbidities that complicate the patient evaluation  Coronary artery disease   Additional history obtained:  Additional history obtained from wife External records from outside source obtained and reviewed including hospital record   Lab Tests:  I Ordered, and personally interpreted labs.  The pertinent results include: CBC chemistries which show glucose 184 and 2 troponins negative   Imaging Studies ordered:  I ordered imaging studies including 6 I independently visualized and interpreted imaging which showed negative I agree with the radiologist interpretation   Cardiac Monitoring: / EKG:  The patient was maintained on a cardiac monitor.  I personally viewed and interpreted the cardiac monitored which  showed an underlying rhythm of: Normal sinus rhythm   Consultations Obtained:  No consult  Problem List / ED Course / Critical interventions / Medication management  Bee sting and chest pain I ordered medication including Benadryl Solu-Medrol Reevaluation of the patient after these medicines showed that the patient improved I have reviewed the patients home medicines and have made adjustments as needed   Social Determinants of Health:  None   Test / Admission - Considered:  None   Patient with bee sting with allergic reaction that seems to be more local.  Also chest discomfort that was relieved by nitro.  He will be discharged home with follow-up with his cardiologist        Final Clinical Impression(s) / ED Diagnoses Final diagnoses:  Atypical chest pain  Allergic reaction, initial encounter    Rx / DC Orders ED Discharge Orders          Ordered    predniSONE (DELTASONE) 20 MG tablet        07/30/21 1204              Milton Ferguson, MD 07/31/21 1754

## 2021-07-30 NOTE — Discharge Instructions (Signed)
Take Benadryl for any itching or swelling.  Follow-up with your cardiologist next week.  Return if any problem

## 2021-07-31 ENCOUNTER — Other Ambulatory Visit: Payer: Self-pay | Admitting: *Deleted

## 2021-07-31 MED ORDER — LOSARTAN POTASSIUM 25 MG PO TABS: 25.0000 mg | ORAL_TABLET | Freq: Every day | ORAL | 6 refills | Status: DC

## 2021-08-03 ENCOUNTER — Encounter: Payer: Medicare Other | Admitting: Psychology

## 2021-08-05 ENCOUNTER — Ambulatory Visit: Payer: Medicare Other | Admitting: Physician Assistant

## 2021-08-06 ENCOUNTER — Encounter: Payer: Self-pay | Admitting: *Deleted

## 2021-08-07 ENCOUNTER — Encounter: Payer: Self-pay | Admitting: *Deleted

## 2021-08-10 NOTE — Telephone Encounter (Signed)
Would just stop entresto and start losartan, losartan would be '25mg'$  daily once daily   J Aeva Posey MD

## 2021-08-17 DIAGNOSIS — E559 Vitamin D deficiency, unspecified: Secondary | ICD-10-CM | POA: Diagnosis not present

## 2021-08-17 DIAGNOSIS — E782 Mixed hyperlipidemia: Secondary | ICD-10-CM | POA: Diagnosis not present

## 2021-08-17 DIAGNOSIS — E291 Testicular hypofunction: Secondary | ICD-10-CM | POA: Diagnosis not present

## 2021-08-17 DIAGNOSIS — Z125 Encounter for screening for malignant neoplasm of prostate: Secondary | ICD-10-CM | POA: Diagnosis not present

## 2021-08-17 DIAGNOSIS — R7301 Impaired fasting glucose: Secondary | ICD-10-CM | POA: Diagnosis not present

## 2021-08-25 DIAGNOSIS — N182 Chronic kidney disease, stage 2 (mild): Secondary | ICD-10-CM | POA: Diagnosis not present

## 2021-08-25 DIAGNOSIS — E291 Testicular hypofunction: Secondary | ICD-10-CM | POA: Diagnosis not present

## 2021-08-25 DIAGNOSIS — E559 Vitamin D deficiency, unspecified: Secondary | ICD-10-CM | POA: Diagnosis not present

## 2021-08-25 DIAGNOSIS — K219 Gastro-esophageal reflux disease without esophagitis: Secondary | ICD-10-CM | POA: Diagnosis not present

## 2021-08-25 DIAGNOSIS — R17 Unspecified jaundice: Secondary | ICD-10-CM | POA: Diagnosis not present

## 2021-08-25 DIAGNOSIS — R253 Fasciculation: Secondary | ICD-10-CM | POA: Diagnosis not present

## 2021-08-25 DIAGNOSIS — I5022 Chronic systolic (congestive) heart failure: Secondary | ICD-10-CM | POA: Diagnosis not present

## 2021-08-25 DIAGNOSIS — F5221 Male erectile disorder: Secondary | ICD-10-CM | POA: Diagnosis not present

## 2021-08-25 DIAGNOSIS — M6281 Muscle weakness (generalized): Secondary | ICD-10-CM | POA: Diagnosis not present

## 2021-08-25 DIAGNOSIS — E782 Mixed hyperlipidemia: Secondary | ICD-10-CM | POA: Diagnosis not present

## 2021-08-25 DIAGNOSIS — R7301 Impaired fasting glucose: Secondary | ICD-10-CM | POA: Diagnosis not present

## 2021-08-25 DIAGNOSIS — H04123 Dry eye syndrome of bilateral lacrimal glands: Secondary | ICD-10-CM | POA: Diagnosis not present

## 2021-10-19 ENCOUNTER — Encounter: Payer: Self-pay | Admitting: Cardiology

## 2021-10-19 ENCOUNTER — Ambulatory Visit: Payer: Medicare Other | Attending: Cardiology | Admitting: Cardiology

## 2021-10-19 VITALS — BP 120/66 | HR 56 | Ht 73.0 in | Wt 201.6 lb

## 2021-10-19 DIAGNOSIS — E782 Mixed hyperlipidemia: Secondary | ICD-10-CM | POA: Insufficient documentation

## 2021-10-19 DIAGNOSIS — I5022 Chronic systolic (congestive) heart failure: Secondary | ICD-10-CM | POA: Diagnosis not present

## 2021-10-19 DIAGNOSIS — I10 Essential (primary) hypertension: Secondary | ICD-10-CM | POA: Insufficient documentation

## 2021-10-19 DIAGNOSIS — I2510 Atherosclerotic heart disease of native coronary artery without angina pectoris: Secondary | ICD-10-CM

## 2021-10-19 NOTE — Patient Instructions (Addendum)

## 2021-10-19 NOTE — Progress Notes (Signed)
Clinical Summary Andrew Mcgrath is a 81 y.o.male seen today for follow up of the following medical problems.   1.Chronic systolic HF/HFimpEF - 99/3570 echo: LVEF 40%,  - 02/2021 nuclear stress: no ischemia. Scar vs artifact in the inferior/inferoseptal walls.  - from prior clinic note prefers to avoid invasive procedures if possible - denies SOB/DOE, no recent LE edema. Walks 2-3 miles daily.  - compliant with meds -home bps 100-121/50-60   HRs 50s to 60s.  - entresto costs $1100 a year but for now manageable for them.      05/2021 echo: LVEF 50-55%, grade I dd -entresto was expensive, transitioned to losartan.  - no SOB/DOE - compliaint with meds. Has samples on entresto, has stayed on. And not changed to losartan yet.      2.Chronic LBBB     3.HTN - compliant with meds     4. Hyperlipidemia Jan 2023 TC 142 TG 126 HDL 43 LDL 77 -compliant with meds   Past Medical History:  Diagnosis Date   Bradycardia    Familial restless leg syndrome    GERD (gastroesophageal reflux disease)    Hyperlipidemia      Allergies  Allergen Reactions   Bee Venom Swelling   Penicillins     Pt and spouse state pt is possibly allergic to penicillin both unsure of reaction      Current Outpatient Medications  Medication Sig Dispense Refill   aspirin EC 81 MG tablet Take 1 tablet (81 mg total) by mouth daily. 30 tablet 11   atorvastatin (LIPITOR) 20 MG tablet Take 1 tablet (20 mg total) by mouth daily. 90 tablet 3   cetirizine (ZYRTEC) 10 MG tablet Take 10 mg by mouth daily as needed for allergies.     diclofenac Sodium (VOLTAREN) 1 % GEL Apply 1 application topically daily as needed (pain).     diphenhydrAMINE (BENADRYL) 25 MG tablet Take 50 mg by mouth every 6 (six) hours as needed for itching or allergies.     EPINEPHrine 0.3 mg/0.3 mL IJ SOAJ injection Inject 0.3 mg into the muscle as needed for anaphylaxis (Bee stings).     famotidine (PEPCID) 40 MG tablet Take 40 mg by mouth  daily.     fluticasone (FLONASE) 50 MCG/ACT nasal spray Place 1 spray into both nostrils daily as needed for allergies or rhinitis.     losartan (COZAAR) 25 MG tablet Take 1 tablet (25 mg total) by mouth daily. 30 tablet 6   metoprolol succinate (TOPROL XL) 25 MG 24 hr tablet Take 1 tablet (25 mg total) by mouth daily. 90 tablet 3   Multiple Vitamins-Minerals (MULTIVITAMIN WITH MINERALS) tablet Take 1 tablet by mouth daily.     Polyethyl Glycol-Propyl Glycol 0.4-0.3 % SOLN Place 1-2 drops into both eyes daily as needed (dry eyes).     predniSONE (DELTASONE) 20 MG tablet 2 tabs po daily x 3 days 6 tablet 0   sildenafil (VIAGRA) 100 MG tablet Take 1 tablet by mouth as needed for erectile dysfunction.     testosterone cypionate (DEPOTESTOSTERONE CYPIONATE) 200 MG/ML injection Inject 200 mg into the muscle every 21 ( twenty-one) days. (Patient not taking: Reported on 07/30/2021)     triamcinolone cream (KENALOG) 0.1 % Apply 1 Application topically daily as needed (skin irritation).     No current facility-administered medications for this visit.     Past Surgical History:  Procedure Laterality Date   APPENDECTOMY     CARDIAC CATHETERIZATION  COLONOSCOPY     COLONOSCOPY N/A 08/23/2013   Procedure: COLONOSCOPY;  Surgeon: Rogene Houston, MD;  Location: [facility];  Service: Endoscopy;  Laterality: N/A;  1200   COLONOSCOPY N/A 11/06/2020   Procedure: COLONOSCOPY;  Surgeon: Rogene Houston, MD;  Location: [facility];  Service: Endoscopy;  Laterality: N/A;  10:30   POLYPECTOMY  11/06/2020   Procedure: POLYPECTOMY;  Surgeon: Rogene Houston, MD;  Location: [facility];  Service: Endoscopy;;   TONSILLECTOMY       Allergies  Allergen Reactions   Bee Venom Swelling   Penicillins     Pt and spouse state pt is possibly allergic to penicillin both unsure of reaction       Family History  Problem Relation Age of Onset   Colon cancer Neg Hx      Social History Andrew Mcgrath  reports that he has quit smoking. His smoking use included cigarettes. He has a 30.00 pack-year smoking history. He has never used smokeless tobacco. Andrew Mcgrath reports current alcohol use.   Review of Systems CONSTITUTIONAL: No weight loss, fever, chills, weakness or fatigue.  HEENT: Eyes: No visual loss, blurred vision, double vision or yellow sclerae.No hearing loss, sneezing, congestion, runny nose or sore throat.  SKIN: No rash or itching.  CARDIOVASCULAR: per hpi RESPIRATORY: No shortness of breath, cough or sputum.  GASTROINTESTINAL: No anorexia, nausea, vomiting or diarrhea. No abdominal pain or blood.  GENITOURINARY: No burning on urination, no polyuria NEUROLOGICAL: No headache, dizziness, syncope, paralysis, ataxia, numbness or tingling in the extremities. No change in bowel or bladder control.  MUSCULOSKELETAL: No muscle, back pain, joint pain or stiffness.  LYMPHATICS: No enlarged nodes. No history of splenectomy.  PSYCHIATRIC: No history of depression or anxiety.  ENDOCRINOLOGIC: No reports of sweating, cold or heat intolerance. No polyuria or polydipsia.  Marland Kitchen   Physical Examination Today's Vitals   10/19/21 1553  BP: 120/66  Pulse: (!) 56  SpO2: 95%  Weight: 201 lb 9.6 oz (91.4 kg)  Height: '6\' 1"'$  (1.854 m)   Body mass index is 26.6 kg/m.  Gen: resting comfortably, no acute distress HEENT: no scleral icterus, pupils equal round and reactive, no palptable cervical adenopathy,  CV: RRR, no m/r/g, no jvd Resp: Clear to auscultation bilaterally GI: abdomen is soft, non-tender, non-distended, normal bowel sounds, no hepatosplenomegaly MSK: extremities are warm, no edema.  Skin: warm, no rash Neuro:  no focal deficits Psych: appropriate affect   Diagnostic Studies  12/2020 echo 1. Difficult apical windows Images are foreshortened in some views  Overall LVEF is depressed with global hypokinesis, worse in the inferior,  inferoseptal walls.. Left ventricular  ejection fraction, by estimation, is  40%%. The left ventricle has  moderately decreased function. The left ventricle demonstrates global  hypokinesis. Indeterminate diastolic filling due to E-A fusion.   2. Right ventricular systolic function is low normal. The right  ventricular size is normal.   3. Left atrial size was moderately dilated.   4. The mitral valve is normal in structure. No evidence of mitral valve  regurgitation.   5. The aortic valve is tricuspid. Aortic valve regurgitation is not  visualized.   6. The inferior vena cava is normal in size with greater than 50%  respiratory variability, suggesting right atrial pressure of 3 mmHg.    02/2021 nuclear stress  Lexiscan stress is electrically nondiagnostic due to LBBB   Myoview scan with a fixed defect in the inferior (base, mid, distal), inferoseptal (  base, mid) wall consistent with scar and possible soft tissue attenuation (diaphragm)   No ischemia   LVEF on gating is 50% with mild global hypokinesis   Overall low risk scan    05/2021 echo 1. Left ventricular ejection fraction, by estimation, is 50 to 55%. The  left ventricle has low normal function. The left ventricle has no regional  wall motion abnormalities. Left ventricular diastolic parameters are  consistent with Grade I diastolic  dysfunction (impaired relaxation). The average left ventricular global  longitudinal strain is -18.3 %. The global longitudinal strain is normal.   2. Right ventricular systolic function is normal. The right ventricular  size is normal.   3. The mitral valve is normal in structure. Trivial mitral valve  regurgitation. No evidence of mitral stenosis.   4. The aortic valve is tricuspid. There is mild calcification of the  aortic valve. There is mild thickening of the aortic valve. Aortic valve  regurgitation is not visualized. No aortic stenosis is present.   Comparison(s): Echocardiogram done 01/15/21 showed an EF of  40%.   Assessment and Plan   1.Chronic systolic HF/HFimpEF - LVEF has normalized, no symptoms - entresto expensive though has some samples at home, can switch to losartan when samples run out.      2.HTN - at goal, continue current meds  3. Hyperlipidemia - at goal, continue current meds    Arnoldo Lenis, M.D.

## 2021-11-02 DIAGNOSIS — Z23 Encounter for immunization: Secondary | ICD-10-CM | POA: Diagnosis not present

## 2021-12-29 ENCOUNTER — Other Ambulatory Visit: Payer: Self-pay | Admitting: Cardiology

## 2022-01-01 DIAGNOSIS — H838X3 Other specified diseases of inner ear, bilateral: Secondary | ICD-10-CM | POA: Diagnosis not present

## 2022-01-01 DIAGNOSIS — H6123 Impacted cerumen, bilateral: Secondary | ICD-10-CM | POA: Diagnosis not present

## 2022-01-01 DIAGNOSIS — H903 Sensorineural hearing loss, bilateral: Secondary | ICD-10-CM | POA: Diagnosis not present

## 2022-01-04 ENCOUNTER — Other Ambulatory Visit: Payer: Self-pay | Admitting: Cardiology

## 2022-01-13 IMAGING — CT CT HEAD W/O CM
4 series · 16 of 47 positions shown, 18 images · non-contrast
Comparison: None.

CLINICAL DATA: Headache and dizziness.  Confusion.

EXAM:
CT HEAD WITHOUT CONTRAST
TECHNIQUE: Contiguous axial images were obtained from the base of the skull
through the vertex without intravenous contrast.

[Series 2: head w o · axial · 0.45mm/px · z∈[+26,+141]mm · 7 of 31 slices shown, 9 images]
[im 4/31  brain]
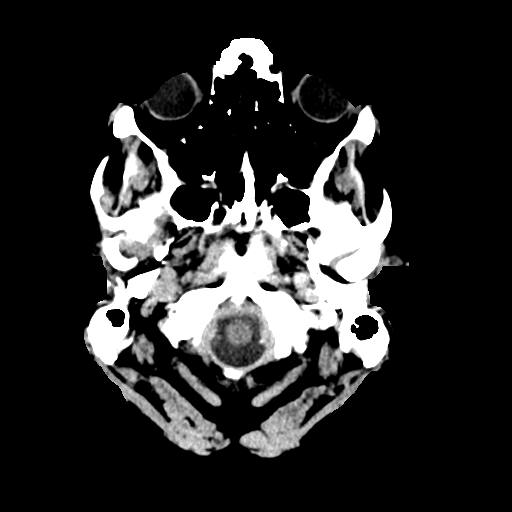
[im 4/31  bone]
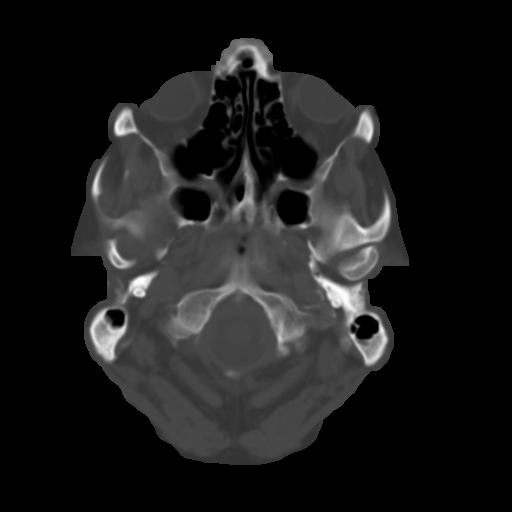
[im 8/31  brain]
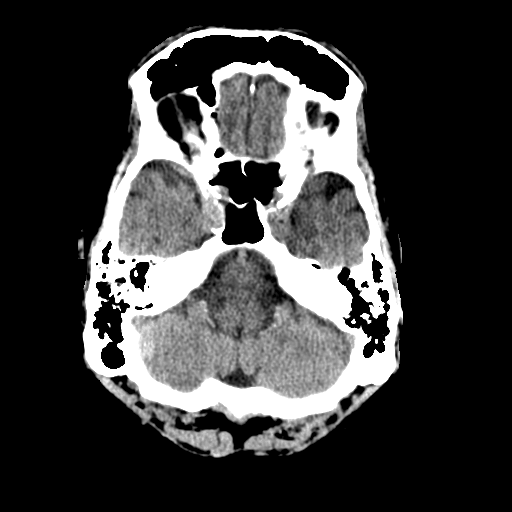
[im 12/31  brain]
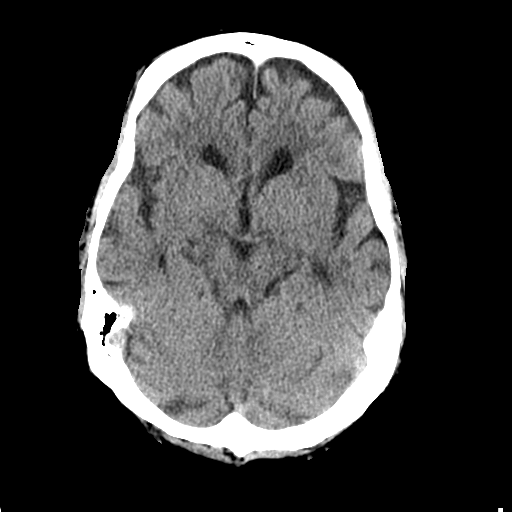
[im 16/31  brain]
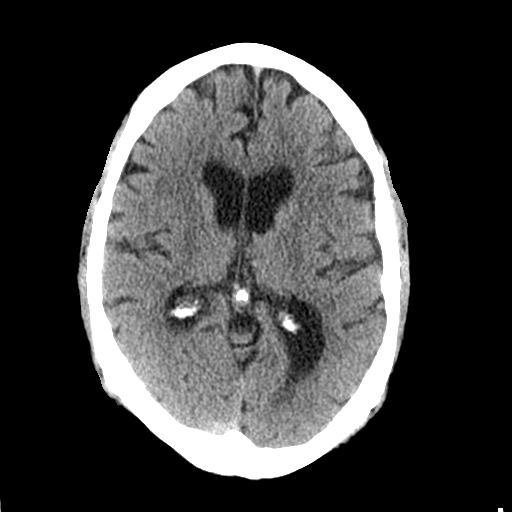
[im 19/31  brain]
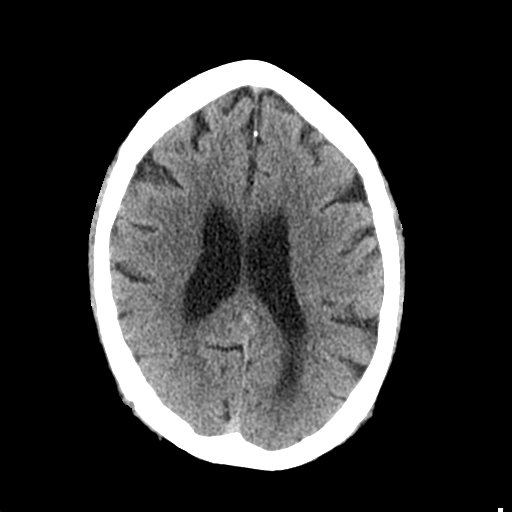
[im 19/31  bone]
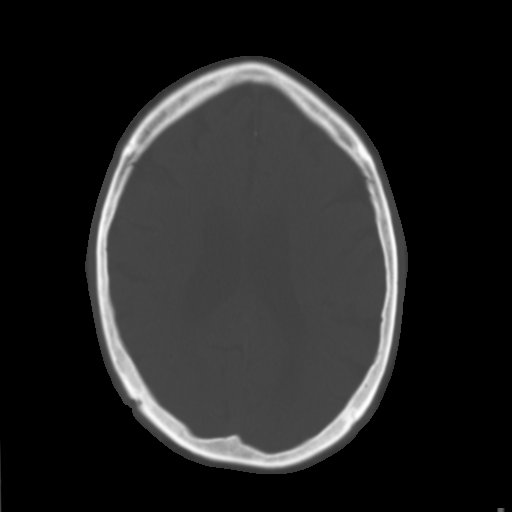
[im 23/31  brain]
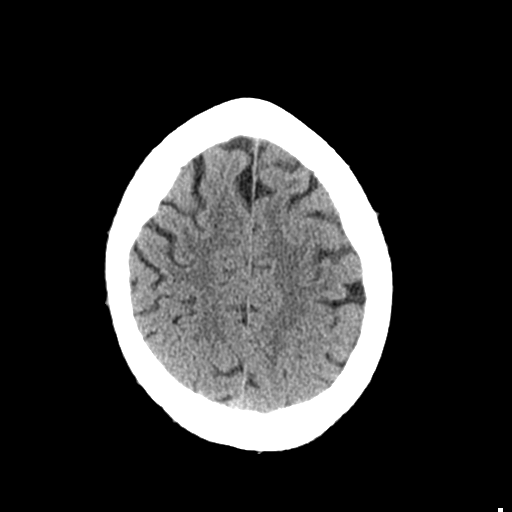
[im 27/31  brain]
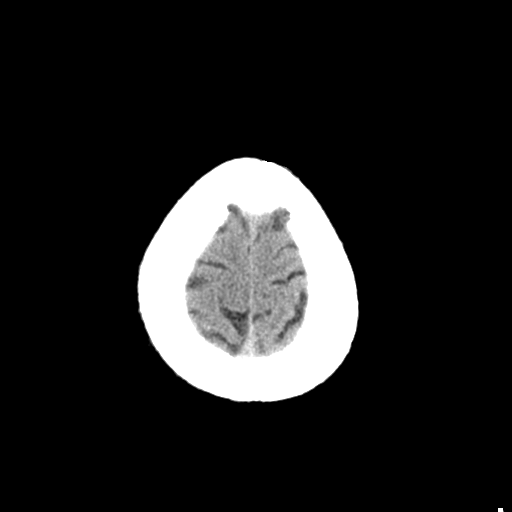

[Series 3: head bone · axial · 0.45mm/px · z∈[+25,+57]mm · 3 of 78 slices shown]
[im 8/78  bone]
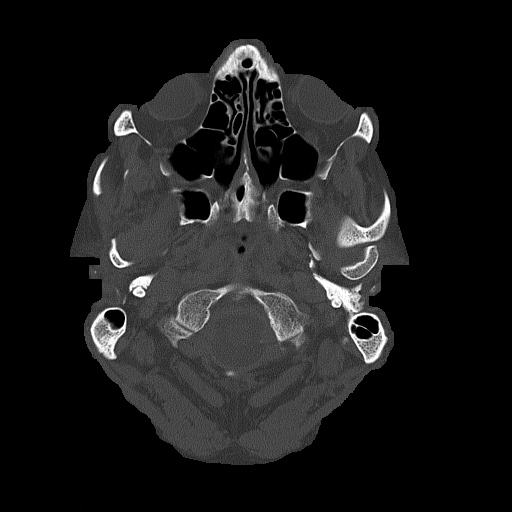
[im 16/78  bone]
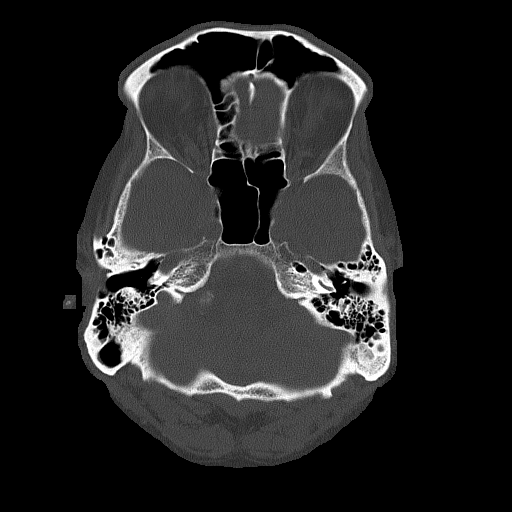
[im 24/78  bone]
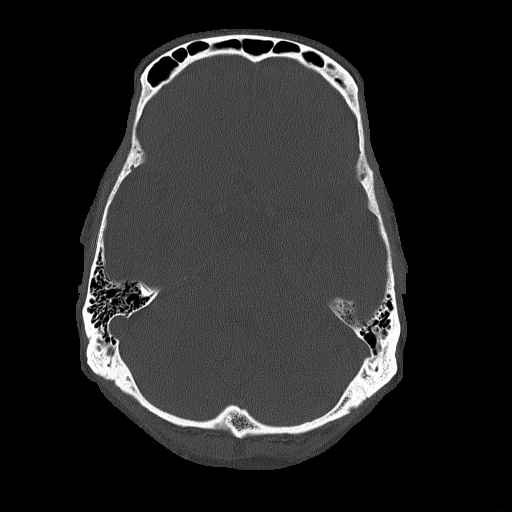

[Series 4: coronal soft · coronal · 0.34mm/px · 3 of 82 slices shown]
[im 28/82  brain]
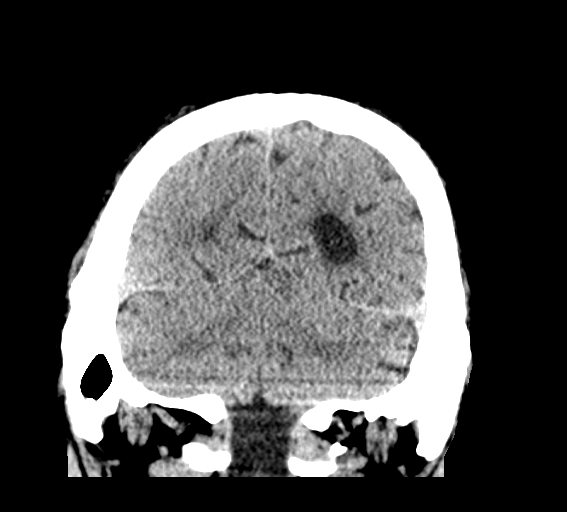
[im 37/82  brain]
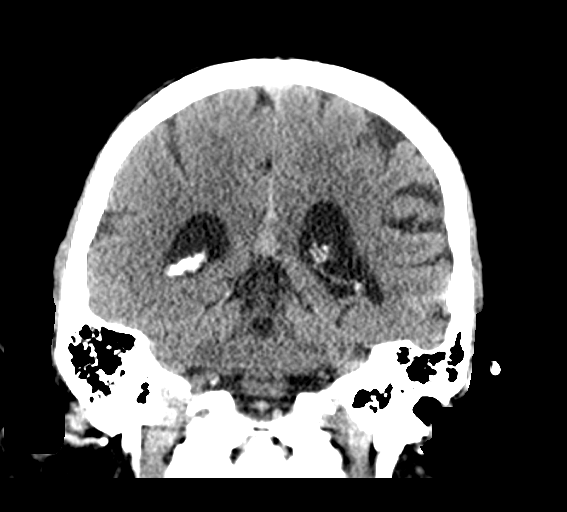
[im 46/82  brain]
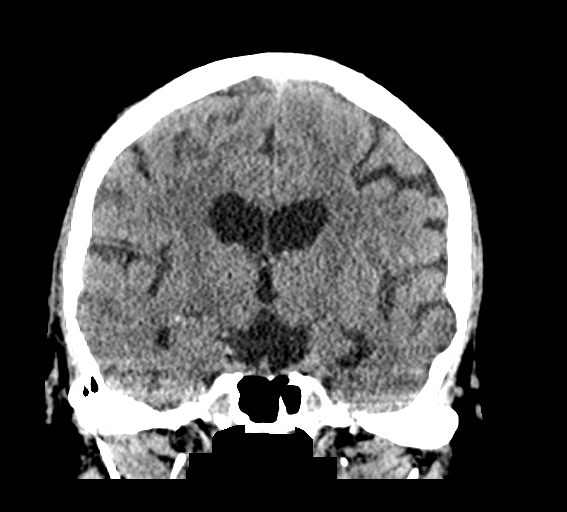

[Series 5: sagittal soft · sagittal · 0.35mm/px · 3 of 62 slices shown]
[im 21/62  brain]
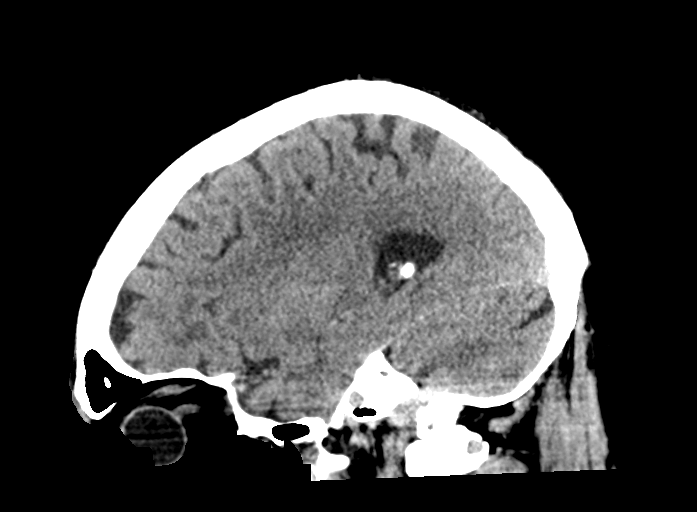
[im 31/62  brain]
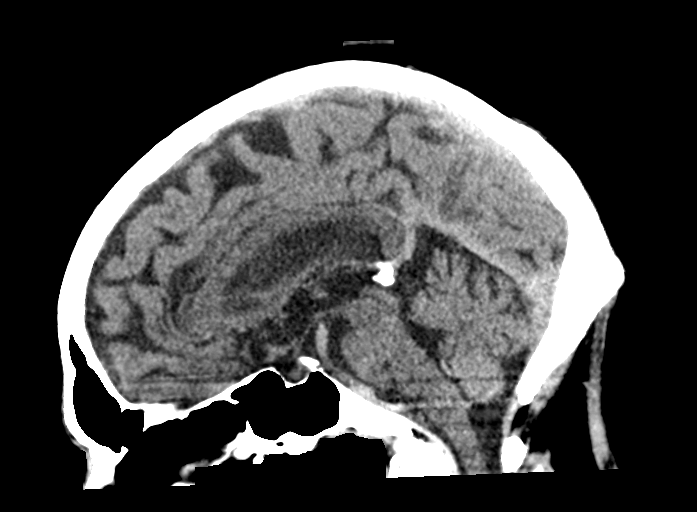
[im 41/62  brain]
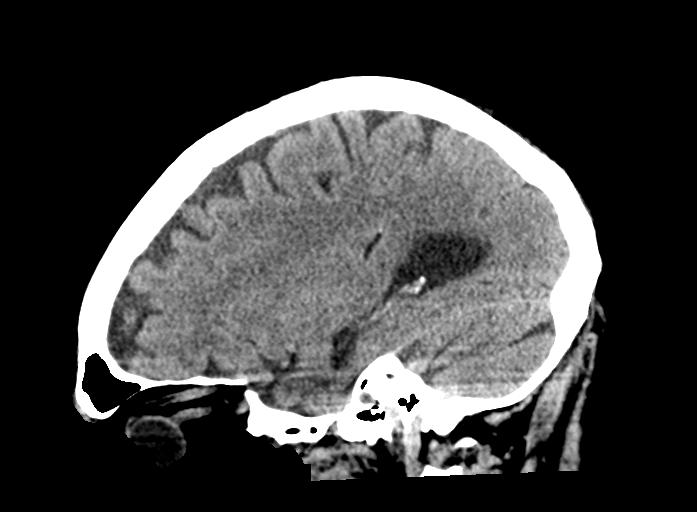

[16 of 47 positions shown; findings below may reference images not displayed]

FINDINGS: Brain: No evidence of acute infarction, hemorrhage, hydrocephalus,
extra-axial collection or mass lesion/mass effect. There is mild
diffuse low-attenuation within the subcortical and periventricular
white matter compatible with chronic microvascular disease.

Vascular: No hyperdense vessel or unexpected calcification.

Skull: Normal. Negative for fracture or focal lesion.

Sinuses/Orbits: No acute finding.

Other: None.
IMPRESSION: 1. No acute intracranial abnormalities.
2. Chronic small vessel ischemic change and brain atrophy.

## 2022-01-26 ENCOUNTER — Encounter: Payer: Self-pay | Admitting: Cardiology

## 2022-01-27 MED ORDER — ENTRESTO 24-26 MG PO TABS: 1.0000 | ORAL_TABLET | Freq: Two times a day (BID) | ORAL | 6 refills | Status: DC

## 2022-01-27 NOTE — Telephone Encounter (Signed)
Ok to send in the entrest prescription, can take losartan off his list  Zandra Abts MD

## 2022-02-11 DIAGNOSIS — D225 Melanocytic nevi of trunk: Secondary | ICD-10-CM | POA: Diagnosis not present

## 2022-02-11 DIAGNOSIS — L570 Actinic keratosis: Secondary | ICD-10-CM | POA: Diagnosis not present

## 2022-02-11 DIAGNOSIS — Z808 Family history of malignant neoplasm of other organs or systems: Secondary | ICD-10-CM | POA: Diagnosis not present

## 2022-02-11 DIAGNOSIS — L814 Other melanin hyperpigmentation: Secondary | ICD-10-CM | POA: Diagnosis not present

## 2022-02-11 DIAGNOSIS — Z85828 Personal history of other malignant neoplasm of skin: Secondary | ICD-10-CM | POA: Diagnosis not present

## 2022-02-11 DIAGNOSIS — L821 Other seborrheic keratosis: Secondary | ICD-10-CM | POA: Diagnosis not present

## 2022-02-11 DIAGNOSIS — D2272 Melanocytic nevi of left lower limb, including hip: Secondary | ICD-10-CM | POA: Diagnosis not present

## 2022-02-11 DIAGNOSIS — Z86018 Personal history of other benign neoplasm: Secondary | ICD-10-CM | POA: Diagnosis not present

## 2022-02-11 DIAGNOSIS — L578 Other skin changes due to chronic exposure to nonionizing radiation: Secondary | ICD-10-CM | POA: Diagnosis not present

## 2022-02-22 DIAGNOSIS — E291 Testicular hypofunction: Secondary | ICD-10-CM | POA: Diagnosis not present

## 2022-02-22 DIAGNOSIS — E782 Mixed hyperlipidemia: Secondary | ICD-10-CM | POA: Diagnosis not present

## 2022-02-22 DIAGNOSIS — R7301 Impaired fasting glucose: Secondary | ICD-10-CM | POA: Diagnosis not present

## 2022-02-22 DIAGNOSIS — E559 Vitamin D deficiency, unspecified: Secondary | ICD-10-CM | POA: Diagnosis not present

## 2022-02-25 DIAGNOSIS — M6281 Muscle weakness (generalized): Secondary | ICD-10-CM | POA: Diagnosis not present

## 2022-02-25 DIAGNOSIS — R69 Illness, unspecified: Secondary | ICD-10-CM | POA: Diagnosis not present

## 2022-02-25 DIAGNOSIS — I130 Hypertensive heart and chronic kidney disease with heart failure and stage 1 through stage 4 chronic kidney disease, or unspecified chronic kidney disease: Secondary | ICD-10-CM | POA: Diagnosis not present

## 2022-02-25 DIAGNOSIS — E291 Testicular hypofunction: Secondary | ICD-10-CM | POA: Diagnosis not present

## 2022-02-25 DIAGNOSIS — K219 Gastro-esophageal reflux disease without esophagitis: Secondary | ICD-10-CM | POA: Diagnosis not present

## 2022-02-25 DIAGNOSIS — R7303 Prediabetes: Secondary | ICD-10-CM | POA: Diagnosis not present

## 2022-02-25 DIAGNOSIS — E559 Vitamin D deficiency, unspecified: Secondary | ICD-10-CM | POA: Diagnosis not present

## 2022-02-25 DIAGNOSIS — Z0001 Encounter for general adult medical examination with abnormal findings: Secondary | ICD-10-CM | POA: Diagnosis not present

## 2022-02-25 DIAGNOSIS — E782 Mixed hyperlipidemia: Secondary | ICD-10-CM | POA: Diagnosis not present

## 2022-02-25 DIAGNOSIS — N182 Chronic kidney disease, stage 2 (mild): Secondary | ICD-10-CM | POA: Diagnosis not present

## 2022-02-25 DIAGNOSIS — I5022 Chronic systolic (congestive) heart failure: Secondary | ICD-10-CM | POA: Diagnosis not present

## 2022-02-25 DIAGNOSIS — I10 Essential (primary) hypertension: Secondary | ICD-10-CM | POA: Diagnosis not present

## 2022-03-18 DIAGNOSIS — L84 Corns and callosities: Secondary | ICD-10-CM | POA: Diagnosis not present

## 2022-03-18 DIAGNOSIS — M722 Plantar fascial fibromatosis: Secondary | ICD-10-CM | POA: Diagnosis not present

## 2022-03-18 DIAGNOSIS — M79671 Pain in right foot: Secondary | ICD-10-CM | POA: Diagnosis not present

## 2022-03-18 DIAGNOSIS — M7741 Metatarsalgia, right foot: Secondary | ICD-10-CM | POA: Diagnosis not present

## 2022-04-12 DIAGNOSIS — W228XXA Striking against or struck by other objects, initial encounter: Secondary | ICD-10-CM | POA: Diagnosis not present

## 2022-04-12 DIAGNOSIS — I10 Essential (primary) hypertension: Secondary | ICD-10-CM | POA: Diagnosis not present

## 2022-04-12 DIAGNOSIS — S60511A Abrasion of right hand, initial encounter: Secondary | ICD-10-CM | POA: Diagnosis not present

## 2022-04-12 DIAGNOSIS — Z79899 Other long term (current) drug therapy: Secondary | ICD-10-CM | POA: Diagnosis not present

## 2022-04-12 DIAGNOSIS — Z23 Encounter for immunization: Secondary | ICD-10-CM | POA: Diagnosis not present

## 2022-04-23 ENCOUNTER — Encounter: Payer: Self-pay | Admitting: Cardiology

## 2022-04-23 ENCOUNTER — Ambulatory Visit: Payer: Medicare HMO | Attending: Cardiology | Admitting: Cardiology

## 2022-04-23 VITALS — BP 128/80 | HR 63 | Ht 73.0 in | Wt 203.4 lb

## 2022-04-23 DIAGNOSIS — E782 Mixed hyperlipidemia: Secondary | ICD-10-CM

## 2022-04-23 DIAGNOSIS — I5022 Chronic systolic (congestive) heart failure: Secondary | ICD-10-CM

## 2022-04-23 DIAGNOSIS — I10 Essential (primary) hypertension: Secondary | ICD-10-CM

## 2022-04-23 DIAGNOSIS — I5020 Unspecified systolic (congestive) heart failure: Secondary | ICD-10-CM

## 2022-04-23 NOTE — Patient Instructions (Signed)
Medication Instructions:  Your physician recommends that you continue on your current medications as directed. Please refer to the Current Medication list given to you today.  *If you need a refill on your cardiac medications before your next appointment, please call your pharmacy*   Lab Work: None If you have labs (blood work) drawn today and your tests are completely normal, you will receive your results only by: MyChart Message (if you have MyChart) OR A paper copy in the mail If you have any lab test that is abnormal or we need to change your treatment, we will call you to review the results.   Testing/Procedures: None   Follow-Up: At [facility], you and your health needs are our priority.  As part of our continuing mission to provide you with exceptional heart care, we have created designated Provider Care Teams.  These Care Teams include your primary Cardiologist (physician) and Advanced Practice Providers (APPs -  Physician Assistants and Nurse Practitioners) who all work together to provide you with the care you need, when you need it.  We recommend signing up for the patient portal called "MyChart".  Sign up information is provided on this After Visit Summary.  MyChart is used to connect with patients for Virtual Visits (Telemedicine).  Patients are able to view lab/test results, encounter notes, upcoming appointments, etc.  Non-urgent messages can be sent to your provider as well.   To learn more about what you can do with MyChart, go to [email or web address].    Your next appointment:   6 month(s)  Provider:   Jonathan Branch, MD    Other Instructions    

## 2022-04-23 NOTE — Progress Notes (Signed)
Clinical Summary Andrew Mcgrath is a 82 y.o.male   seen today for follow up of the following medical problems.    1.Chronic systolic HF/HFimpEF - [id number] echo: LVEF 40%,  - 02/2021 nuclear stress: no ischemia. Scar vs artifact in the inferior/inferoseptal walls.  - from prior clinic note prefers to avoid invasive procedures if possible - denies SOB/DOE, no recent LE edema. Walks 2-3 miles daily.  - compliant with meds -home bps 100-121/50-60   HRs 50s to 60s.  - entresto costs $1100 a year but for now manageable for them.      05/2021 echo: LVEF 50-55%, grade I dd   - no SOB/DOE, no recent edema - compliant with meds.  - walks daily x 4 miles every day without exertional symptoms.      2.Chronic LBBB     3.HTN -he is compliant with meds     4. Hyperlipidemia Jan 2023 TC 142 TG 126 HDL 43 LDL 77 - Jan 2024 TC 141 TG 78 HDL 53 LDL 73 -compliant with meds Past Medical History:  Diagnosis Date   Bradycardia    Familial restless leg syndrome    GERD (gastroesophageal reflux disease)    Hyperlipidemia      Allergies  Allergen Reactions   Bee Venom Swelling   Penicillins     Pt and spouse state pt is possibly allergic to penicillin both unsure of reaction      Current Outpatient Medications  Medication Sig Dispense Refill   aspirin EC 81 MG tablet Take 1 tablet (81 mg total) by mouth daily. 30 tablet 11   atorvastatin (LIPITOR) 20 MG tablet Take 1 tablet (20 mg total) by mouth daily. 90 tablet 0   cetirizine (ZYRTEC) 10 MG tablet Take 10 mg by mouth daily as needed for allergies.     diclofenac Sodium (VOLTAREN) 1 % GEL Apply 1 application topically daily as needed (pain).     diphenhydrAMINE (BENADRYL) 25 MG tablet Take 50 mg by mouth every 6 (six) hours as needed for itching or allergies.     EPINEPHrine 0.3 mg/0.3 mL IJ SOAJ injection Inject 0.3 mg into the muscle as needed for anaphylaxis (Bee stings).     famotidine (PEPCID) 40 MG tablet Take 40 mg by  mouth daily.     fluticasone (FLONASE) 50 MCG/ACT nasal spray Place 1 spray into both nostrils daily as needed for allergies or rhinitis.     metoprolol succinate (TOPROL-XL) 25 MG 24 hr tablet TAKE (1) TABLET BY MOUTH ONCE DAILY. 90 tablet 2   Multiple Vitamins-Minerals (MULTIVITAMIN WITH MINERALS) tablet Take 1 tablet by mouth daily.     Polyethyl Glycol-Propyl Glycol 0.4-0.3 % SOLN Place 1-2 drops into both eyes daily as needed (dry eyes).     sacubitril-valsartan (ENTRESTO) 24-26 MG Take 1 tablet by mouth 2 (two) times daily. 60 tablet 6   sildenafil (VIAGRA) 100 MG tablet Take 1 tablet by mouth as needed for erectile dysfunction.     testosterone cypionate (DEPOTESTOSTERONE CYPIONATE) 200 MG/ML injection Inject 200 mg into the muscle every 21 ( twenty-one) days.     No current facility-administered medications for this visit.     Past Surgical History:  Procedure Laterality Date   APPENDECTOMY     CARDIAC CATHETERIZATION     COLONOSCOPY     COLONOSCOPY N/A 08/23/2013   Procedure: COLONOSCOPY;  Surgeon: Rogene Houston, MD;  Location: [facility];  Service: Endoscopy;  Laterality: N/A;  1200  COLONOSCOPY N/A 11/06/2020   Procedure: COLONOSCOPY;  Surgeon: Rogene Houston, MD;  Location: [facility];  Service: Endoscopy;  Laterality: N/A;  10:30   POLYPECTOMY  11/06/2020   Procedure: POLYPECTOMY;  Surgeon: Rogene Houston, MD;  Location: [facility];  Service: Endoscopy;;   TONSILLECTOMY       Allergies  Allergen Reactions   Bee Venom Swelling   Penicillins     Pt and spouse state pt is possibly allergic to penicillin both unsure of reaction       Family History  Problem Relation Age of Onset   Colon cancer Neg Hx      Social History Mr. Pfuhl reports that he has quit smoking. His smoking use included cigarettes. He has a 30.00 pack-year smoking history. He has never used smokeless tobacco. Mr. Parlapiano reports current alcohol use.   Review of  Systems CONSTITUTIONAL: No weight loss, fever, chills, weakness or fatigue.  HEENT: Eyes: No visual loss, blurred vision, double vision or yellow sclerae.No hearing loss, sneezing, congestion, runny nose or sore throat.  SKIN: No rash or itching.  CARDIOVASCULAR: per hpi RESPIRATORY: No shortness of breath, cough or sputum.  GASTROINTESTINAL: No anorexia, nausea, vomiting or diarrhea. No abdominal pain or blood.  GENITOURINARY: No burning on urination, no polyuria NEUROLOGICAL: No headache, dizziness, syncope, paralysis, ataxia, numbness or tingling in the extremities. No change in bowel or bladder control.  MUSCULOSKELETAL: No muscle, back pain, joint pain or stiffness.  LYMPHATICS: No enlarged nodes. No history of splenectomy.  PSYCHIATRIC: No history of depression or anxiety.  ENDOCRINOLOGIC: No reports of sweating, cold or heat intolerance. No polyuria or polydipsia.  Marland Kitchen   Physical Examination Today's Vitals   04/23/22 1113  BP: 128/80  Pulse: 63  SpO2: 96%  Weight: 203 lb 6.4 oz (92.3 kg)  Height: 6\' 1"  (1.854 m)   Body mass index is 26.84 kg/m.  Gen: resting comfortably, no acute distress HEENT: no scleral icterus, pupils equal round and reactive, no palptable cervical adenopathy,  CV: RRR, no m/rg, no jvd Resp: Clear to auscultation bilaterally GI: abdomen is soft, non-tender, non-distended, normal bowel sounds, no hepatosplenomegaly MSK: extremities are warm, no edema.  Skin: warm, no rash Neuro:  no focal deficits Psych: appropriate affect   Diagnostic Studies   12/2020 echo 1. Difficult apical windows Images are foreshortened in some views  Overall LVEF is depressed with global hypokinesis, worse in the inferior,  inferoseptal walls.. Left ventricular ejection fraction, by estimation, is  40%%. The left ventricle has  moderately decreased function. The left ventricle demonstrates global  hypokinesis. Indeterminate diastolic filling due to E-A fusion.   2.  Right ventricular systolic function is low normal. The right  ventricular size is normal.   3. Left atrial size was moderately dilated.   4. The mitral valve is normal in structure. No evidence of mitral valve  regurgitation.   5. The aortic valve is tricuspid. Aortic valve regurgitation is not  visualized.   6. The inferior vena cava is normal in size with greater than 50%  respiratory variability, suggesting right atrial pressure of 3 mmHg.    02/2021 nuclear stress  Lexiscan stress is electrically nondiagnostic due to LBBB   Myoview scan with a fixed defect in the inferior (base, mid, distal), inferoseptal (base, mid) wall consistent with scar and possible soft tissue attenuation (diaphragm)   No ischemia   LVEF on gating is 50% with mild global hypokinesis   Overall low risk scan  05/2021 echo 1. Left ventricular ejection fraction, by estimation, is 50 to 55%. The  left ventricle has low normal function. The left ventricle has no regional  wall motion abnormalities. Left ventricular diastolic parameters are  consistent with Grade I diastolic  dysfunction (impaired relaxation). The average left ventricular global  longitudinal strain is -18.3 %. The global longitudinal strain is normal.   2. Right ventricular systolic function is normal. The right ventricular  size is normal.   3. The mitral valve is normal in structure. Trivial mitral valve  regurgitation. No evidence of mitral stenosis.   4. The aortic valve is tricuspid. There is mild calcification of the  aortic valve. There is mild thickening of the aortic valve. Aortic valve  regurgitation is not visualized. No aortic stenosis is present.   Comparison(s): Echocardiogram done 01/15/21 showed an EF of 40%  Assessment and Plan   1.HFimpEF - LVEF has normalized - entresto expensive, ok for now but may need to change to ARB later in the year - no symptoms, continue current meds     2.HTN - bp is at goal, continue  current meds   3. Hyperlipidemia - he is at goal, continue current meds  F/u 6 months     Arnoldo Lenis, M.D.

## 2022-06-24 ENCOUNTER — Ambulatory Visit (HOSPITAL_COMMUNITY)
Admission: RE | Admit: 2022-06-24 | Discharge: 2022-06-24 | Disposition: A | Discharge status: Home / Self Care | Payer: Medicare HMO | Source: Ambulatory Visit | Attending: Family Medicine | Admitting: Family Medicine

## 2022-06-24 ENCOUNTER — Encounter (HOSPITAL_COMMUNITY): Payer: Self-pay | Admitting: Family Medicine

## 2022-06-24 ENCOUNTER — Other Ambulatory Visit (HOSPITAL_COMMUNITY): Payer: Self-pay | Admitting: Family Medicine

## 2022-06-24 DIAGNOSIS — M7989 Other specified soft tissue disorders: Secondary | ICD-10-CM | POA: Diagnosis not present

## 2022-06-24 DIAGNOSIS — R2241 Localized swelling, mass and lump, right lower limb: Secondary | ICD-10-CM

## 2022-07-21 ENCOUNTER — Emergency Department (HOSPITAL_COMMUNITY): Payer: Medicare HMO

## 2022-07-21 ENCOUNTER — Observation Stay (HOSPITAL_COMMUNITY)
Admission: EM | Admit: 2022-07-21 | Discharge: 2022-07-22 | Disposition: A | Discharge status: Home / Self Care | Payer: Medicare HMO | Attending: Family Medicine | Admitting: Family Medicine

## 2022-07-21 ENCOUNTER — Observation Stay (HOSPITAL_BASED_OUTPATIENT_CLINIC_OR_DEPARTMENT_OTHER): Payer: Medicare HMO

## 2022-07-21 ENCOUNTER — Encounter (HOSPITAL_COMMUNITY): Payer: Self-pay | Admitting: Emergency Medicine

## 2022-07-21 ENCOUNTER — Other Ambulatory Visit: Payer: Self-pay

## 2022-07-21 ENCOUNTER — Observation Stay (HOSPITAL_COMMUNITY)
Admit: 2022-07-21 | Discharge: 2022-07-21 | Disposition: A | Discharge status: Home / Self Care | Payer: Medicare HMO | Attending: Family Medicine | Admitting: Family Medicine

## 2022-07-21 DIAGNOSIS — R0602 Shortness of breath: Secondary | ICD-10-CM | POA: Diagnosis not present

## 2022-07-21 DIAGNOSIS — R519 Headache, unspecified: Secondary | ICD-10-CM | POA: Diagnosis not present

## 2022-07-21 DIAGNOSIS — Z79899 Other long term (current) drug therapy: Secondary | ICD-10-CM | POA: Diagnosis not present

## 2022-07-21 DIAGNOSIS — R55 Syncope and collapse: Secondary | ICD-10-CM | POA: Diagnosis not present

## 2022-07-21 DIAGNOSIS — Z87891 Personal history of nicotine dependence: Secondary | ICD-10-CM | POA: Diagnosis not present

## 2022-07-21 DIAGNOSIS — I447 Left bundle-branch block, unspecified: Secondary | ICD-10-CM | POA: Diagnosis not present

## 2022-07-21 DIAGNOSIS — R569 Unspecified convulsions: Secondary | ICD-10-CM

## 2022-07-21 DIAGNOSIS — G4489 Other headache syndrome: Secondary | ICD-10-CM | POA: Diagnosis not present

## 2022-07-21 DIAGNOSIS — R9089 Other abnormal findings on diagnostic imaging of central nervous system: Secondary | ICD-10-CM | POA: Diagnosis not present

## 2022-07-21 DIAGNOSIS — I5040 Unspecified combined systolic (congestive) and diastolic (congestive) heart failure: Secondary | ICD-10-CM | POA: Insufficient documentation

## 2022-07-21 DIAGNOSIS — R0689 Other abnormalities of breathing: Secondary | ICD-10-CM | POA: Diagnosis not present

## 2022-07-21 DIAGNOSIS — R064 Hyperventilation: Secondary | ICD-10-CM | POA: Diagnosis not present

## 2022-07-21 DIAGNOSIS — I6523 Occlusion and stenosis of bilateral carotid arteries: Secondary | ICD-10-CM | POA: Diagnosis not present

## 2022-07-21 DIAGNOSIS — G459 Transient cerebral ischemic attack, unspecified: Secondary | ICD-10-CM | POA: Diagnosis not present

## 2022-07-21 DIAGNOSIS — I771 Stricture of artery: Secondary | ICD-10-CM | POA: Diagnosis not present

## 2022-07-21 DIAGNOSIS — Z743 Need for continuous supervision: Secondary | ICD-10-CM | POA: Diagnosis not present

## 2022-07-21 DIAGNOSIS — R918 Other nonspecific abnormal finding of lung field: Secondary | ICD-10-CM | POA: Diagnosis not present

## 2022-07-21 DIAGNOSIS — Z7982 Long term (current) use of aspirin: Secondary | ICD-10-CM | POA: Insufficient documentation

## 2022-07-21 DIAGNOSIS — I672 Cerebral atherosclerosis: Secondary | ICD-10-CM | POA: Diagnosis not present

## 2022-07-21 LAB — BASIC METABOLIC PANEL
Anion gap: 11 (ref 5–15)
BUN: 13 mg/dL (ref 8–23)
CO2: 22 mmol/L (ref 22–32)
Calcium: 9.2 mg/dL (ref 8.9–10.3)
Chloride: 103 mmol/L (ref 98–111)
Creatinine, Ser: 1.08 mg/dL (ref 0.61–1.24)
GFR, Estimated: >60 mL/min (ref >60)
Glucose, Bld: 117 mg/dL — ABNORMAL HIGH (ref 70–99)
Potassium: 3.4 mmol/L — ABNORMAL LOW (ref 3.5–5.1)
Sodium: 136 mmol/L (ref 135–145)

## 2022-07-21 LAB — CBC WITH DIFFERENTIAL/PLATELET
Abs Immature Granulocytes: 0.01 10*3/uL (ref 0.00–0.07)
Basophils Absolute: 0.1 10*3/uL (ref 0.0–0.1)
Basophils Relative: 1 %
Eosinophils Absolute: 0.1 10*3/uL (ref 0.0–0.5)
Eosinophils Relative: 2 %
HCT: 46.5 % (ref 39.0–52.0)
Hemoglobin: 16.3 g/dL (ref 13.0–17.0)
Immature Granulocytes: 0 %
Lymphocytes Relative: 41 %
Lymphs Abs: 2.3 10*3/uL (ref 0.7–4.0)
MCH: 31.1 pg (ref 26.0–34.0)
MCHC: 35.1 g/dL (ref 30.0–36.0)
MCV: 88.7 fL (ref 80.0–100.0)
Monocytes Absolute: 0.3 10*3/uL (ref 0.1–1.0)
Monocytes Relative: 6 %
Neutro Abs: 2.8 10*3/uL (ref 1.7–7.7)
Neutrophils Relative %: 50 %
Platelets: 168 10*3/uL (ref 150–400)
RBC: 5.24 MIL/uL (ref 4.22–5.81)
RDW: 13.9 % (ref 11.5–15.5)
WBC: 5.6 10*3/uL (ref 4.0–10.5)
nRBC: 0 % (ref 0.0–0.2)

## 2022-07-21 LAB — TROPONIN I (HIGH SENSITIVITY)
Troponin I (High Sensitivity): 11 ng/L (ref <18)
Troponin I (High Sensitivity): 27 ng/L — ABNORMAL HIGH (ref <18)

## 2022-07-21 LAB — ECHOCARDIOGRAM COMPLETE
Area-P 1/2: 1.75 cm2
Height: 73 in
S' Lateral: 3.4 cm
Weight: 3181.68 oz

## 2022-07-21 LAB — D-DIMER, QUANTITATIVE: D-Dimer, Quant: 0.27 ug/mL-FEU (ref 0.00–0.50)

## 2022-07-21 LAB — BRAIN NATRIURETIC PEPTIDE: B Natriuretic Peptide: 84 pg/mL (ref 0.0–100.0)

## 2022-07-21 MED ORDER — SODIUM CHLORIDE 0.9% FLUSH: 3.0000 mL | INTRAVENOUS | Status: DC | PRN

## 2022-07-21 MED ORDER — HEPARIN SODIUM (PORCINE) 5000 UNIT/ML IJ SOLN
5000.0000 [IU] | Freq: Three times a day (TID) | INTRAMUSCULAR | Status: DC
  Administered 2022-07-21 – 2022-07-22 (×3): 5000 [IU] via SUBCUTANEOUS
  Filled 2022-07-21 (×3): qty 1

## 2022-07-21 MED ORDER — BISACODYL 10 MG RE SUPP: 10.0000 mg | Freq: Every day | RECTAL | Status: DC | PRN

## 2022-07-21 MED ORDER — ONDANSETRON HCL 4 MG/2ML IJ SOLN: 4.0000 mg | Freq: Four times a day (QID) | INTRAMUSCULAR | Status: DC | PRN

## 2022-07-21 MED ORDER — ACETAMINOPHEN 325 MG PO TABS: 650.0000 mg | ORAL_TABLET | Freq: Four times a day (QID) | ORAL | Status: DC | PRN

## 2022-07-21 MED ORDER — IOHEXOL 350 MG/ML SOLN
75.0000 mL | Freq: Once | INTRAVENOUS | Status: AC | PRN
  Administered 2022-07-21: 75 mL via INTRAVENOUS

## 2022-07-21 MED ORDER — SODIUM CHLORIDE 0.9% FLUSH
3.0000 mL | Freq: Two times a day (BID) | INTRAVENOUS | Status: DC
  Administered 2022-07-21 – 2022-07-22 (×3): 3 mL via INTRAVENOUS

## 2022-07-21 MED ORDER — ACETAMINOPHEN 650 MG RE SUPP: 650.0000 mg | Freq: Four times a day (QID) | RECTAL | Status: DC | PRN

## 2022-07-21 MED ORDER — SODIUM CHLORIDE 0.9 % IV SOLN: INTRAVENOUS | Status: DC | PRN

## 2022-07-21 MED ORDER — POLYETHYLENE GLYCOL 3350 17 G PO PACK: 17.0000 g | PACK | Freq: Every day | ORAL | Status: DC | PRN

## 2022-07-21 MED ORDER — SACUBITRIL-VALSARTAN 24-26 MG PO TABS
1.0000 | ORAL_TABLET | Freq: Two times a day (BID) | ORAL | Status: DC
  Administered 2022-07-21 – 2022-07-22 (×3): 1 via ORAL
  Filled 2022-07-21 (×3): qty 1

## 2022-07-21 MED ORDER — POTASSIUM CHLORIDE CRYS ER 20 MEQ PO TBCR
40.0000 meq | EXTENDED_RELEASE_TABLET | Freq: Once | ORAL | Status: AC
  Administered 2022-07-21: 40 meq via ORAL
  Filled 2022-07-21: qty 2

## 2022-07-21 MED ORDER — TRAZODONE HCL 50 MG PO TABS: 50.0000 mg | ORAL_TABLET | Freq: Every evening | ORAL | Status: DC | PRN

## 2022-07-21 MED ORDER — ONDANSETRON HCL 4 MG PO TABS: 4.0000 mg | ORAL_TABLET | Freq: Four times a day (QID) | ORAL | Status: DC | PRN

## 2022-07-21 MED ORDER — ATORVASTATIN CALCIUM 40 MG PO TABS
40.0000 mg | ORAL_TABLET | Freq: Every day | ORAL | Status: DC
  Administered 2022-07-21 – 2022-07-22 (×2): 40 mg via ORAL
  Filled 2022-07-21 (×2): qty 1

## 2022-07-21 MED ORDER — PANTOPRAZOLE SODIUM 40 MG PO TBEC
40.0000 mg | DELAYED_RELEASE_TABLET | Freq: Every day | ORAL | Status: DC
  Administered 2022-07-21 – 2022-07-22 (×2): 40 mg via ORAL
  Filled 2022-07-21 (×2): qty 1

## 2022-07-21 MED ORDER — ASPIRIN 81 MG PO TBEC
81.0000 mg | DELAYED_RELEASE_TABLET | Freq: Every day | ORAL | Status: DC
  Administered 2022-07-22: 81 mg via ORAL
  Filled 2022-07-21 (×2): qty 1

## 2022-07-21 MED ORDER — ASPIRIN 81 MG PO CHEW
324.0000 mg | CHEWABLE_TABLET | Freq: Once | ORAL | Status: AC
  Administered 2022-07-21: 324 mg via ORAL
  Filled 2022-07-21: qty 4

## 2022-07-21 MED ORDER — METOPROLOL SUCCINATE ER 25 MG PO TB24
25.0000 mg | ORAL_TABLET | Freq: Every day | ORAL | Status: DC
  Administered 2022-07-21 – 2022-07-22 (×2): 25 mg via ORAL
  Filled 2022-07-21 (×2): qty 1

## 2022-07-21 NOTE — Progress Notes (Signed)
EEG complete - results pending 

## 2022-07-21 NOTE — ED Provider Notes (Signed)
Physical Exam  BP 120/75   Pulse (!) 58   Temp 97.7 F (36.5 C) (Oral)   Resp 12   Wt 93 kg   SpO2 94%   BMI 27.05 kg/m   Physical Exam  Procedures  Procedures  ED Course / MDM   Clinical Course as of 07/21/22 1051  Wed Jul 21, 2022  1610 Wife at bedside now states the patient got up to go to the bathroom and a short time after he returned to bed, he began gasping for air, turned purple and would not respond to her. This lasted for a few minutes which was when she called EMS. He quickly returned to normal before EMS arrived and that was when he began complaining of headache. He is feeling better now. No further headache or SOB. His SpO2 was borderline low on arrival but no tachycardia. Will check cardiac labs and Dimer and add CXR to prior workup.  [CS]  [id number] I personally viewed the images from radiology studies and agree with radiologist interpretation: CT head is neg for acute process [CS]  0651 CBC is normal.  [CS]  0702 BMP is unremarkable.  [CS]  [id number] I personally viewed the images from radiology studies and agree with radiologist interpretation: CXR is neg for acute infiltrates  [CS]  0720 Dimer is negative.  [CS]  0730 Initial Trop is normal.  [CS]  0741 Care of the patient signed out to Dr. Rhunette Croft at the change of shift.  [CS]    Clinical Course User Index [CS] Pollyann Savoy, MD   Medical Decision Making Amount and/or Complexity of Data Reviewed Labs: ordered. Radiology: ordered.  Risk OTC drugs. Decision regarding hospitalization.   Assumed care from Dr. Bernette Mayers. I assessed the patient after his 2nd trop was elevated to 27, up from 11.  According to the wife, patient had return to the bed after going to the bathroom.  She then started hearing gagging type noise.  She asked the patient if he is okay, he did not respond.  He then had some repeat episode, and when she turned on the light she noted that patient was purple and unresponsive.  She panicked and  called 911.  By the time they arrived, patient was conscious.  Patient does not recall anything that occurred after he sat down in his bed to when EMS arrived.  He indicates that he did feel like something was off when he returned back to his bed.  There is no history of seizures, stroke.  Patient denies any chest pain, palpitations, arrhythmia.  Patient states that after he came to, he has been having posterior headache and dizziness.  Dizziness is described as spinning sensation.  Those symptoms lasted until 8 or 9 AM.  But now he has been feeling well, he does not have a headache or spinning sensation.  His neuroexam is reassuring.  I reviewed patient's previous records.  He had an MRI in 2022 that was negative.  It appears to me that most likely patient had a seizure or syncope, and if that is the case the underlying cause could be arrhythmia or circulatory issue.  Other possibilities TIA.  I have discussed case with neurology.  Dr. Jerrell Belfast recommends that we get MRI of the brain, CT angiogram head and neck and he will put an EEG order.  Neurology team will physically round on the patient tomorrow.  I will request for hospitalist admission for syncope.  Patient's troponin is elevated, but it is  unlikely to be ACS.  Repeat EKG is reassuring.  We will give him aspirin.   EKG Interpretation  Date/Time:  Wednesday July 21 2022 09:28:10 EDT Ventricular Rate:  56 PR Interval:  234 QRS Duration: 160 QT Interval:  484 QTC Calculation: 468 R Axis:   13 Text Interpretation: Sinus rhythm Prolonged PR interval Left bundle branch block No significant change since last tracing Confirmed by Derwood Kaplan [phone]) on 07/21/2022 9:51:03 AM               Derwood Kaplan, MD 07/21/22 1056

## 2022-07-21 NOTE — ED Triage Notes (Signed)
Sudden onset HA that started as he was walking back to bed. A&Ox4 for EMS.  Denies fall, LOC. Not on thinners. EMS exam: CBG 103, afebrile, 132/80, HR 88, 94% RA, tremors at baseline, no focal neuro deficits for EMS

## 2022-07-21 NOTE — H&P (Signed)
Patient Demographics:    Andrew Mcgrath, is a 82 y.o. male  MRN: [id number]   DOB - 1940-06-29  Admit Date - 07/21/2022  Outpatient Primary MD for the patient is Benita Stabile, MD   Assessment & Plan:   Assessment and Plan:  1)Syncope- -Patient denies any chest pains palpitations dizziness or prodromal symptoms prior to losing consciousness - admit to telemetry monitored unit, watch for arrhythmias,  -Ruled out for ACS by cardiac enzymes and EKG Troponin 11 >> 27,  D-Dimer 0.27 BNP--84.0  --Twelve-lead EKG with sinus rhythm,LBBB which is not new, no significant change from prior -EEG without epileptiform findings -Echo with preserved EF 50 to 55%, grade 1 diastolic dysfunction, no mitral or aortic stenosis noted -Chest x-ray without acute findings -MRI brain without acute findings CT angio head and neck without acute findings, no LVO, CT head without contrast acute findings -If no significant arrhythmia on telemetry overnight patient will be discharged home with outpatient Holter/event monitor -On-call neurology input appreciated please see HPI section below  2) combined systolic and diastolic dysfunction CHF/HFimpEF , chronic diastolic dysfunction CHF- -repeat echo from 07/18/2022 as noted above #1 -Continue Entresto and Toprol XL  3) transient hypoxia---In the ED sat went down to 89%, placed patient back on 2L for support. Patient does not wear O2 at home. Current sat is 95%.  On 2 L of oxygen   4)Social/Ethics--- plan of care  discussed with patient's daughter, wife and brother at bedside -Full code  5)HLD--- continue atorvastatin  6)GERD-supple, continue Protonix   Dispo: The patient is from: Home              Anticipated d/c is to: Home              Anticipated d/c date is: 1 day               Patient currently is not medically stable to d/c. Barriers: Not Clinically Stable-   With History of - Reviewed by me  Past Medical History:  Diagnosis Date   Bradycardia    Familial restless leg syndrome    GERD (gastroesophageal reflux disease)    Hyperlipidemia       Past Surgical History:  Procedure Laterality Date   APPENDECTOMY     CARDIAC CATHETERIZATION     COLONOSCOPY     COLONOSCOPY N/A 08/23/2013   Procedure: COLONOSCOPY;  Surgeon: Malissa Hippo, MD;  Location: [facility];  Service: Endoscopy;  Laterality: N/A;  1200   COLONOSCOPY N/A 11/06/2020   Procedure: COLONOSCOPY;  Surgeon: Malissa Hippo, MD;  Location: [facility];  Service: Endoscopy;  Laterality: N/A;  10:30   POLYPECTOMY  11/06/2020   Procedure: POLYPECTOMY;  Surgeon: Malissa Hippo, MD;  Location: [facility];  Service: Endoscopy;;   TONSILLECTOMY     Chief Complaint  Patient presents with   Headache      HPI:  Andrew Mcgrath  is a 82 y.o. male past medical history relevant for chronic headaches,, memory problems, HFimpEF , chronic diastolic dysfunction CHF, hypogonadism, HLD and GERD who presents to the ED by EMS with concerns for fall episode of loss of consciousness -Apparently had a sudden onset of headache while walking back from bathroom to bed then became somewhat unresponsive for show wife called EMS -Wife believes patient may have had transient speech problems when he was coming around -He had involuntary movements but no defined seizures per se -Additional history obtained from patient's daughter, wife and brother at bedside -EMS exam: CBG 103, afebrile, 132/80, HR 88, 94% RA, tremors at baseline, no focal neuro deficits  In the ED sat went down to 89%, placed patient back on 2L for support. Patient does not wear O2 at home. Current sat is 95%.  -Patient denies any chest pains palpitations dizziness or prodromal symptoms prior to losing consciousness -No incontinence or tongue  biting -EDP discussed case with on-call neurology Dr. Milon Dikes who recommended Obs, MR brain w/o, CTA head and neck, EEG, tele and syncope work up due to concerns about Concern for Seizure Vs Syncope Vs Vertigo  No fever  Or chills  No Nausea, Vomiting or Diarrhea -Twelve-lead EKG with sinus rhythm,LBBB which is not new, no significant change from prior -EEG without epileptiform findings -Echo with preserved EF 50 to 55%, grade 1 diastolic dysfunction, no mitral or aortic stenosis noted -Chest x-ray without acute findings -MRI brain without acute findings CT angio head and neck without acute findings, no LVO, CT head without contrast acute findings -CBC WNL -Creatinine 1.08 and potassium 3.4    Review of systems:    In addition to the HPI above,   A full Review of  Systems was done, all other systems reviewed are negative except as noted above in HPI , .    Social History:  Reviewed by me    Social History   Tobacco Use   Smoking status: Former    Packs/day: 1.50    Years: 20.00    Additional pack years: 0.00    Total pack years: 30.00    Types: Cigarettes   Smokeless tobacco: Never  Substance Use Topics   Alcohol use: Yes    Comment: wine    Family History :  Reviewed by me    Family History  Problem Relation Age of Onset   Heart failure Father    Diabetes Daughter    Diabetes Son    Colon cancer Neg Hx     Home Medications:   Prior to Admission medications   Medication Sig Start Date End Date Taking? Authorizing Provider  aspirin EC 81 MG tablet Take 1 tablet (81 mg total) by mouth daily. 11/09/20  Yes Rehman, Joline Maxcy, MD  atorvastatin (LIPITOR) 20 MG tablet Take 1 tablet (20 mg total) by mouth daily. 12/29/21 07/21/22 Yes Jake Bathe, MD  metoprolol succinate (TOPROL-XL) 25 MG 24 hr tablet TAKE (1) TABLET BY MOUTH ONCE DAILY. Patient taking differently: Take 25 mg by mouth daily. 01/05/22  Yes BranchDorothe Pea, MD  Multiple Vitamins-Minerals  (MULTIVITAMIN WITH MINERALS) tablet Take 1 tablet by mouth daily.   Yes [provider]  pantoprazole (PROTONIX) 20 MG tablet Take 40 mg by mouth daily. 02/25/22  Yes [provider]  sacubitril-valsartan (ENTRESTO) 24-26 MG Take 1 tablet by mouth 2 (two) times daily. 01/27/22  Yes Antoine Poche, MD  testosterone cypionate (DEPOTESTOSTERONE CYPIONATE) 200 MG/ML  injection Inject 200 mg into the muscle every 21 ( twenty-one) days.   Yes [provider]  EPINEPHrine 0.3 mg/0.3 mL IJ SOAJ injection Inject 0.3 mg into the muscle as needed for anaphylaxis (Bee stings).    [provider]     Allergies:     Allergies  Allergen Reactions   Bee Venom Swelling   Penicillins     Pt and spouse state pt is possibly allergic to penicillin both unsure of reaction      Physical Exam:   Vitals  Blood pressure (!) 140/73, pulse (!) 58, temperature 97.7 F (36.5 C), temperature source Oral, resp. rate 16, height 6\' 1"  (1.854 m), weight 90.2 kg, SpO2 99 %.  Physical Examination: General appearance - alert,  in no distress  Mental status - alert, oriented to person, place, and time,  Eyes - sclera anicteric Neck - supple, no JVD elevation , Chest - clear  to auscultation bilaterally, symmetrical air movement,  Heart - S1 and S2 normal, regular  Abdomen - soft, nontender, nondistended, +BS Neurological - screening mental status exam normal, neck supple without rigidity, cranial nerves II through XII intact, DTR's normal and symmetric Extremities - no pedal edema noted, intact peripheral pulses  Skin - warm, dry     Data Review:    CBC Recent Labs  Lab 07/21/22 0608  WBC 5.6  HGB 16.3  HCT 46.5  PLT 168  MCV 88.7  MCH 31.1  MCHC 35.1  RDW 13.9  LYMPHSABS 2.3  MONOABS 0.3  EOSABS 0.1  BASOSABS 0.1   ------------------------------------------------------------------------------------------------------------------  Chemistries  Recent Labs  Lab  07/21/22 0608  NA 136  K 3.4*  CL 103  CO2 22  GLUCOSE 117*  BUN 13  CREATININE 1.08  CALCIUM 9.2   ------------------------------------------------------------------------------------------------------------------ estimated creatinine clearance is 60.6 mL/min (by C-G formula based on SCr of 1.08 mg/dL). ------------------------------------------------------------------------------------------------------------------ ------------------------------------------------------------------------------------------------------------------- Recent Labs    07/21/22 0608  DDIMER 0.27   ------------------------------------------------------------------------------------------------------------------------------------------------------------------------------------------------------------------------------------    Component Value Date/Time   BNP 84.0 07/21/2022 0608     Urinalysis    Component Value Date/Time   COLORURINE YELLOW 04/08/2010 0232   APPEARANCEUR CLEAR 04/08/2010 0232   LABSPEC 1.015 04/08/2010 0232   PHURINE 6.0 04/08/2010 0232   GLUCOSEU NEGATIVE 04/08/2010 0232   HGBUR NEGATIVE 04/08/2010 0232   BILIRUBINUR NEGATIVE 04/08/2010 0232   KETONESUR NEGATIVE 04/08/2010 0232   PROTEINUR NEGATIVE 04/08/2010 0232   UROBILINOGEN 0.2 04/08/2010 0232   NITRITE NEGATIVE 04/08/2010 0232   LEUKOCYTESUR  04/08/2010 0232    NEGATIVE MICROSCOPIC NOT DONE ON URINES WITH NEGATIVE PROTEIN, BLOOD, LEUKOCYTES, NITRITE, OR GLUCOSE <1000 mg/dL.    ----------------------------------------------------------------------------------------------------------------   Imaging Results:    EEG adult  Result Date: 07/21/2022 Charlsie Quest, MD     07/21/2022  4:58 PM Patient Name: JUD FANGUY MRN: [id number] Epilepsy Attending: Charlsie Quest Referring Physician/Provider: Shon Hale, MD Date: 07/21/2022 Duration: 22.12 mins Patient history: 82yo M with transient alteration of  awareness. EEG to evaluate for seizure. Level of alertness: Awake, drowsy AEDs during EEG study: None Technical aspects: This EEG study was done with scalp electrodes positioned according to the 10-20 International system of electrode placement. Electrical activity was reviewed with band pass filter of 1-70Hz , sensitivity of 7 uV/mm, display speed of 70mm/sec with a 60Hz  notched filter applied as appropriate. EEG data were recorded continuously and digitally stored.  Video monitoring was available and reviewed as appropriate. Description: The posterior dominant rhythm consists of 9  Hz activity of moderate voltage (25-35 uV) seen predominantly in posterior head regions, symmetric and reactive to eye opening and eye closing. Drowsiness was characterized by attenuation of the posterior background rhythm. Physiologic photic driving was not seen during photic stimulation.  Hyperventilation was not performed.   IMPRESSION: This study is within normal limits. No seizures or epileptiform discharges were seen throughout the recording. A normal interictal EEG does not exclude the diagnosis of epilepsy. Charlsie Quest   ECHOCARDIOGRAM COMPLETE  Result Date: 07/21/2022    ECHOCARDIOGRAM REPORT   Patient Name:   THEORDORE CISNERO Date of Exam: 07/21/2022 Medical Rec #:  [id number]         Height:       73.0 in Accession #:    [id number]        Weight:       198.9 lb Date of Birth:  06/24/1940          BSA:          2.146 m Patient Age:    82 years          BP:           140/73 mmHg Patient Gender: M                 HR:           58 bpm. [facility] Procedure: 2D Echo, Cardiac Doppler and Color Doppler Indications:    Syncope R55  History:        Patient has prior history of Echocardiogram examinations, most                 recent 06/15/2021. CHF, Arrythmias:LBBB; Risk                 Factors:Hypertension, Dyslipidemia and Former Smoker.  Sonographer:    Celesta Gentile RCS Referring Phys: [phone] [facility]   Sonographer Comments: No subcostal window. IMPRESSIONS  1. Left ventricular ejection fraction, by estimation, is 50 to 55%. The left ventricle has low normal function. Left ventricular endocardial border not optimally defined to evaluate regional wall motion. Left ventricular diastolic parameters are consistent with Grade I diastolic dysfunction (impaired relaxation).  2. Right ventricular systolic function is normal. The right ventricular size is normal. Tricuspid regurgitation signal is inadequate for assessing PA pressure.  3. Left atrial size was mildly dilated.  4. The mitral valve is normal in structure. No evidence of mitral valve regurgitation. No evidence of mitral stenosis.  5. The aortic valve was not well visualized. Aortic valve regurgitation is not visualized. No aortic stenosis is present.  6. Aortic dilatation noted. There is mild dilatation of the aortic root, measuring 41 mm. FINDINGS  Left Ventricle: Left ventricular ejection fraction, by estimation, is 50 to 55%. The left ventricle has low normal function. Left ventricular endocardial border not optimally defined to evaluate regional wall motion. The left ventricular internal cavity  size was normal in size. There is no left ventricular hypertrophy. Left ventricular diastolic parameters are consistent with Grade I diastolic dysfunction (impaired relaxation). Right Ventricle: The right ventricular size is normal. No increase in right ventricular wall thickness. Right ventricular systolic function is normal. Tricuspid regurgitation signal is inadequate for assessing PA pressure. Left Atrium: Left atrial size was mildly dilated. Right Atrium: Right atrial size was normal in size. Pericardium: There is no evidence of pericardial effusion. Mitral Valve: The mitral valve is normal in structure. No evidence of mitral valve regurgitation. No  evidence of mitral valve stenosis. Tricuspid Valve: The tricuspid valve is not well visualized. Tricuspid valve  regurgitation is not demonstrated. No evidence of tricuspid stenosis. Aortic Valve: The aortic valve was not well visualized. Aortic valve regurgitation is not visualized. No aortic stenosis is present. Pulmonic Valve: The pulmonic valve was not well visualized. Pulmonic valve regurgitation is not visualized. No evidence of pulmonic stenosis. Aorta: Aortic dilatation noted. There is mild dilatation of the aortic root, measuring 41 mm. Venous: The inferior vena cava was not well visualized. IAS/Shunts: The interatrial septum was not well visualized.  LEFT VENTRICLE PLAX 2D LVIDd:         4.90 cm   Diastology LVIDs:         3.40 cm   LV e' medial:    3.48 cm/s LV PW:         1.10 cm   LV E/e' medial:  16.4 LV IVS:        1.10 cm   LV e' lateral:   6.53 cm/s LVOT diam:     2.20 cm   LV E/e' lateral: 8.7 LV SV:         67 LV SV Index:   31 LVOT Area:     3.80 cm  RIGHT VENTRICLE RV S prime:     12.00 cm/s TAPSE (M-mode): 2.4 cm LEFT ATRIUM           Index        RIGHT ATRIUM           Index LA diam:      3.90 cm 1.82 cm/m   RA Area:     14.10 cm LA Vol (A2C): 68.3 ml 31.82 ml/m  RA Volume:   36.90 ml  17.19 ml/m LA Vol (A4C): 87.8 ml 40.91 ml/m  AORTIC VALVE LVOT Vmax:   80.30 cm/s LVOT Vmean:  59.400 cm/s LVOT VTI:    0.175 m  AORTA Ao Root diam: 4.10 cm MITRAL VALVE MV Area (PHT): 1.75 cm     SHUNTS MV Decel Time: 433 msec     Systemic VTI:  0.18 m MV E velocity: 57.00 cm/s   Systemic Diam: 2.20 cm MV A velocity: 107.00 cm/s MV E/A ratio:  0.53 Vishnu Priya Mallipeddi Electronically signed by Winfield Rast Mallipeddi Signature Date/Time: 07/21/2022/4:44:54 PM    Final    MR BRAIN WO CONTRAST  Result Date: 07/21/2022 CLINICAL DATA:  Transient ischemic attack.  Sudden severe headache. EXAM: MRI HEAD WITHOUT CONTRAST TECHNIQUE: Multiplanar, multiecho pulse sequences of the brain and surrounding structures were obtained without intravenous contrast. COMPARISON:  CT studies earlier same day. FINDINGS: Brain:  Diffusion imaging does not show any acute or subacute infarction or other cause of restricted diffusion. No abnormality affects the brainstem or cerebellum. Cerebral hemispheres show mild age related volume loss with very minimal small vessel change of the white matter, less than often seen at this age. No cortical or large vessel territory infarction. No mass, hemorrhage, hydrocephalus or extra-axial collection. Vascular: Major vessels at the base of the brain show flow. Skull and upper cervical spine: Negative Sinuses/Orbits: Paranasal sinuses are clear.  Orbits appear normal. Other: Tiny amount of fluid at the right mastoid tip, probably not significant IMPRESSION: No acute or reversible finding. Mild age related volume loss. Minimal small vessel change of the cerebral hemispheric white matter, less than often seen at this age. Electronically Signed   By: Paulina Fusi M.D.   On: 07/21/2022 11:50   CT  ANGIO HEAD NECK W WO CM  Result Date: 07/21/2022 CLINICAL DATA:  82 year old male with sudden severe headache. Seizure versus syncope. EXAM: CT ANGIOGRAPHY HEAD AND NECK WITH AND WITHOUT CONTRAST TECHNIQUE: Multidetector CT imaging of the head and neck was performed using the standard protocol during bolus administration of intravenous contrast. Multiplanar CT image reconstructions and MIPs were obtained to evaluate the vascular anatomy. Carotid stenosis measurements (when applicable) are obtained utilizing NASCET criteria, using the distal internal carotid diameter as the denominator. RADIATION DOSE REDUCTION: This exam was performed according to the departmental dose-optimization program which includes automated exposure control, adjustment of the mA and/or kV according to patient size and/or use of iterative reconstruction technique. CONTRAST:  75mL OMNIPAQUE IOHEXOL 350 MG/ML SOLN COMPARISON:  Plain head CT 0624 hours today. Brain MRI 02/23/2021. Neck CT 08/10/2017. FINDINGS: CTA NECK Skeleton: Widespread but  generally age-appropriate cervical spine degeneration. No acute or suspicious osseous lesion. Partially visible dextroconvex upper thoracic scoliosis. Upper chest: Negative. Other neck: No acute finding. Aortic arch: 3 vessel arch.  Minimal arch atherosclerosis. Right carotid system: Mild brachiocephalic artery plaque without stenosis. Minimal right CCA plaque, mild tortuosity. No significant right carotid bifurcation or cervical right ICA plaque or stenosis. Left carotid system: Mild left CCA tortuosity, mild combined soft and calcified plaque at the level of the larynx (series 4, image 131). Similar mild soft and calcified plaque at the medial left carotid bifurcation. No significant stenosis to the skull base. Vertebral arteries: Mildly tortuous and calcified proximal right subclavian artery with no stenosis. Normal right vertebral artery origin. Right vertebral artery is patent and within normal limits to the skull base. Minimal proximal left subclavian artery plaque. Normal left vertebral artery origin. Tortuous left V1 segment, partially obscured by adjacent dense venous contrast reflux. But codominant left vertebral artery is otherwise patent and within normal limits to the skull base. CTA HEAD Posterior circulation: Distal vertebral arteries and vertebrobasilar junction are patent with no significant plaque or stenosis. Normal PICA origins. Patent basilar artery without stenosis. Patent SCA and PCA origins. Posterior communicating arteries are diminutive or absent. Bilateral PCA branches are patent and within normal limits. Anterior circulation: Both ICA siphons are patent. Mildly tortuous bilateral ICA siphons with no significant stenosis, only mild calcified plaque primarily on the right. Patent carotid termini, MCA and ACA origins. Tortuous A1 segments, the right is dominant. Anterior communicating artery and bilateral ACA branches are within normal limits. Left MCA M1 segment and bifurcation are patent  without stenosis. Right MCA M1 segment and trifurcation are patent without stenosis. Bilateral MCA branches are within normal limits. Venous sinuses: Patent. Anatomic variants: Dominant right ACA A1. Review of the MIP images confirms the above findings IMPRESSION: Negative for age CTA Head and Neck; tortuous arteries but minimal atherosclerosis and no significant arterial stenosis. Electronically Signed   By: Odessa Fleming M.D.   On: 07/21/2022 11:21   DG Chest 1 View  Result Date: 07/21/2022 CLINICAL DATA:  82 year old male with history of shortness of breath. Sudden onset of headache. EXAM: CHEST  1 VIEW COMPARISON:  Chest x-ray 07/30/2021. FINDINGS: Lung volumes are normal. Mild diffuse interstitial prominence and peribronchial cuffing, similar to prior studies, likely reflective of mild chronic bronchitis. No consolidative airspace disease. No pleural effusions. No pneumothorax. No pulmonary nodule or mass noted. Pulmonary vasculature and the cardiomediastinal silhouette are within normal limits. IMPRESSION: 1. No radiographic evidence of acute cardiopulmonary disease. 2. 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 Electronically Signed   By: Brayton Mars.D.  On: 07/21/2022 07:09   CT Head Wo Contrast  Result Date: 07/21/2022 CLINICAL DATA:  Sudden severe headache. EXAM: CT HEAD WITHOUT CONTRAST TECHNIQUE: Contiguous axial images were obtained from the base of the skull through the vertex without intravenous contrast. RADIATION DOSE REDUCTION: This exam was performed according to the departmental dose-optimization program which includes automated exposure control, adjustment of the mA and/or kV according to patient size and/or use of iterative reconstruction technique. COMPARISON:  Head CT 11/28/2020, MRI brain 02/23/2021. FINDINGS: Brain: There is mild cerebral atrophy, small-vessel disease and atrophic ventriculomegaly, unchanged. The cerebellum and brainstem are unremarkable. No new asymmetry is seen worrisome for  acute infarct, hemorrhage or mass. There is no midline shift. The basal cisterns are clear. Vascular: There are scattered calcifications in both siphons. There are no hyperdense central vessels. Skull: Negative for fractures or focal lesions. Sinuses/Orbits: No acute finding. Clear paranasal sinuses and left mastoids. Chronic fluid in the lower right mastoids. Negative orbits aside from old lens extractions. Other: None. IMPRESSION: 1. No acute intracranial CT findings or interval changes. Chronic changes discussed above. 2. Chronic fluid in the lower right mastoids. Electronically Signed   By: Almira Bar M.D.   On: 07/21/2022 06:38    Radiological Exams on Admission: EEG adult  Result Date: 07/21/2022 Charlsie Quest, MD     07/21/2022  4:58 PM Patient Name: EDDIE PAYETTE MRN: [id number] Epilepsy Attending: Charlsie Quest Referring Physician/Provider: Shon Hale, MD Date: 07/21/2022 Duration: 22.12 mins Patient history: 82yo M with transient alteration of awareness. EEG to evaluate for seizure. Level of alertness: Awake, drowsy AEDs during EEG study: None Technical aspects: This EEG study was done with scalp electrodes positioned according to the 10-20 International system of electrode placement. Electrical activity was reviewed with band pass filter of 1-70Hz , sensitivity of 7 uV/mm, display speed of 31mm/sec with a 60Hz  notched filter applied as appropriate. EEG data were recorded continuously and digitally stored.  Video monitoring was available and reviewed as appropriate. Description: The posterior dominant rhythm consists of 9 Hz activity of moderate voltage (25-35 uV) seen predominantly in posterior head regions, symmetric and reactive to eye opening and eye closing. Drowsiness was characterized by attenuation of the posterior background rhythm. Physiologic photic driving was not seen during photic stimulation.  Hyperventilation was not performed.   IMPRESSION: This study is within normal  limits. No seizures or epileptiform discharges were seen throughout the recording. A normal interictal EEG does not exclude the diagnosis of epilepsy. Charlsie Quest   ECHOCARDIOGRAM COMPLETE  Result Date: 07/21/2022    ECHOCARDIOGRAM REPORT   Patient Name:   DEMAURION DICIOCCIO Date of Exam: 07/21/2022 Medical Rec #:  [id number]         Height:       73.0 in Accession #:    [id number]        Weight:       198.9 lb Date of Birth:  [birth year]          BSA:          2.146 m Patient Age:    82 years          BP:           140/73 mmHg Patient Gender: M                 HR:           58 bpm. [facility] Location[facility] Procedure: 2D Echo, Cardiac Doppler and Color  Doppler Indications:    Syncope R55  History:        Patient has prior history of Echocardiogram examinations, most                 recent 06/15/2021. CHF, Arrythmias:LBBB; Risk                 Factors:Hypertension, Dyslipidemia and Former Smoker.  Sonographer:    Celesta Gentile RCS Referring Phys: [phone] [facility]  Sonographer Comments: No subcostal window. IMPRESSIONS  1. Left ventricular ejection fraction, by estimation, is 50 to 55%. The left ventricle has low normal function. Left ventricular endocardial border not optimally defined to evaluate regional wall motion. Left ventricular diastolic parameters are consistent with Grade I diastolic dysfunction (impaired relaxation).  2. Right ventricular systolic function is normal. The right ventricular size is normal. Tricuspid regurgitation signal is inadequate for assessing PA pressure.  3. Left atrial size was mildly dilated.  4. The mitral valve is normal in structure. No evidence of mitral valve regurgitation. No evidence of mitral stenosis.  5. The aortic valve was not well visualized. Aortic valve regurgitation is not visualized. No aortic stenosis is present.  6. Aortic dilatation noted. There is mild dilatation of the aortic root, measuring 41 mm. FINDINGS  Left Ventricle: Left ventricular ejection  fraction, by estimation, is 50 to 55%. The left ventricle has low normal function. Left ventricular endocardial border not optimally defined to evaluate regional wall motion. The left ventricular internal cavity  size was normal in size. There is no left ventricular hypertrophy. Left ventricular diastolic parameters are consistent with Grade I diastolic dysfunction (impaired relaxation). Right Ventricle: The right ventricular size is normal. No increase in right ventricular wall thickness. Right ventricular systolic function is normal. Tricuspid regurgitation signal is inadequate for assessing PA pressure. Left Atrium: Left atrial size was mildly dilated. Right Atrium: Right atrial size was normal in size. Pericardium: There is no evidence of pericardial effusion. Mitral Valve: The mitral valve is normal in structure. No evidence of mitral valve regurgitation. No evidence of mitral valve stenosis. Tricuspid Valve: The tricuspid valve is not well visualized. Tricuspid valve regurgitation is not demonstrated. No evidence of tricuspid stenosis. Aortic Valve: The aortic valve was not well visualized. Aortic valve regurgitation is not visualized. No aortic stenosis is present. Pulmonic Valve: The pulmonic valve was not well visualized. Pulmonic valve regurgitation is not visualized. No evidence of pulmonic stenosis. Aorta: Aortic dilatation noted. There is mild dilatation of the aortic root, measuring 41 mm. Venous: The inferior vena cava was not well visualized. IAS/Shunts: The interatrial septum was not well visualized.  LEFT VENTRICLE PLAX 2D LVIDd:         4.90 cm   Diastology LVIDs:         3.40 cm   LV e' medial:    3.48 cm/s LV PW:         1.10 cm   LV E/e' medial:  16.4 LV IVS:        1.10 cm   LV e' lateral:   6.53 cm/s LVOT diam:     2.20 cm   LV E/e' lateral: 8.7 LV SV:         67 LV SV Index:   31 LVOT Area:     3.80 cm  RIGHT VENTRICLE RV S prime:     12.00 cm/s TAPSE (M-mode): 2.4 cm LEFT ATRIUM            Index  RIGHT ATRIUM           Index LA diam:      3.90 cm 1.82 cm/m   RA Area:     14.10 cm LA Vol (A2C): 68.3 ml 31.82 ml/m  RA Volume:   36.90 ml  17.19 ml/m LA Vol (A4C): 87.8 ml 40.91 ml/m  AORTIC VALVE LVOT Vmax:   80.30 cm/s LVOT Vmean:  59.400 cm/s LVOT VTI:    0.175 m  AORTA Ao Root diam: 4.10 cm MITRAL VALVE MV Area (PHT): 1.75 cm     SHUNTS MV Decel Time: 433 msec     Systemic VTI:  0.18 m MV E velocity: 57.00 cm/s   Systemic Diam: 2.20 cm MV A velocity: 107.00 cm/s MV E/A ratio:  0.53 Vishnu Priya Mallipeddi Electronically signed by Winfield Rast Mallipeddi Signature Date/Time: 07/21/2022/4:44:54 PM    Final    MR BRAIN WO CONTRAST  Result Date: 07/21/2022 CLINICAL DATA:  Transient ischemic attack.  Sudden severe headache. EXAM: MRI HEAD WITHOUT CONTRAST TECHNIQUE: Multiplanar, multiecho pulse sequences of the brain and surrounding structures were obtained without intravenous contrast. COMPARISON:  CT studies earlier same day. FINDINGS: Brain: Diffusion imaging does not show any acute or subacute infarction or other cause of restricted diffusion. No abnormality affects the brainstem or cerebellum. Cerebral hemispheres show mild age related volume loss with very minimal small vessel change of the white matter, less than often seen at this age. No cortical or large vessel territory infarction. No mass, hemorrhage, hydrocephalus or extra-axial collection. Vascular: Major vessels at the base of the brain show flow. Skull and upper cervical spine: Negative Sinuses/Orbits: Paranasal sinuses are clear.  Orbits appear normal. Other: Tiny amount of fluid at the right mastoid tip, probably not significant IMPRESSION: No acute or reversible finding. Mild age related volume loss. Minimal small vessel change of the cerebral hemispheric white matter, less than often seen at this age. Electronically Signed   By: Paulina Fusi M.D.   On: 07/21/2022 11:50   CT ANGIO HEAD NECK W WO CM  Result Date:  07/21/2022 CLINICAL DATA:  82 year old male with sudden severe headache. Seizure versus syncope. EXAM: CT ANGIOGRAPHY HEAD AND NECK WITH AND WITHOUT CONTRAST TECHNIQUE: Multidetector CT imaging of the head and neck was performed using the standard protocol during bolus administration of intravenous contrast. Multiplanar CT image reconstructions and MIPs were obtained to evaluate the vascular anatomy. Carotid stenosis measurements (when applicable) are obtained utilizing NASCET criteria, using the distal internal carotid diameter as the denominator. RADIATION DOSE REDUCTION: This exam was performed according to the departmental dose-optimization program which includes automated exposure control, adjustment of the mA and/or kV according to patient size and/or use of iterative reconstruction technique. CONTRAST:  75mL OMNIPAQUE IOHEXOL 350 MG/ML SOLN COMPARISON:  Plain head CT 0624 hours today. Brain MRI 02/23/2021. Neck CT 08/10/2017. FINDINGS: CTA NECK Skeleton: Widespread but generally age-appropriate cervical spine degeneration. No acute or suspicious osseous lesion. Partially visible dextroconvex upper thoracic scoliosis. Upper chest: Negative. Other neck: No acute finding. Aortic arch: 3 vessel arch.  Minimal arch atherosclerosis. Right carotid system: Mild brachiocephalic artery plaque without stenosis. Minimal right CCA plaque, mild tortuosity. No significant right carotid bifurcation or cervical right ICA plaque or stenosis. Left carotid system: Mild left CCA tortuosity, mild combined soft and calcified plaque at the level of the larynx (series 4, image 131). Similar mild soft and calcified plaque at the medial left carotid bifurcation. No significant stenosis to the skull base. Vertebral arteries:  Mildly tortuous and calcified proximal right subclavian artery with no stenosis. Normal right vertebral artery origin. Right vertebral artery is patent and within normal limits to the skull base. Minimal proximal  left subclavian artery plaque. Normal left vertebral artery origin. Tortuous left V1 segment, partially obscured by adjacent dense venous contrast reflux. But codominant left vertebral artery is otherwise patent and within normal limits to the skull base. CTA HEAD Posterior circulation: Distal vertebral arteries and vertebrobasilar junction are patent with no significant plaque or stenosis. Normal PICA origins. Patent basilar artery without stenosis. Patent SCA and PCA origins. Posterior communicating arteries are diminutive or absent. Bilateral PCA branches are patent and within normal limits. Anterior circulation: Both ICA siphons are patent. Mildly tortuous bilateral ICA siphons with no significant stenosis, only mild calcified plaque primarily on the right. Patent carotid termini, MCA and ACA origins. Tortuous A1 segments, the right is dominant. Anterior communicating artery and bilateral ACA branches are within normal limits. Left MCA M1 segment and bifurcation are patent without stenosis. Right MCA M1 segment and trifurcation are patent without stenosis. Bilateral MCA branches are within normal limits. Venous sinuses: Patent. Anatomic variants: Dominant right ACA A1. Review of the MIP images confirms the above findings IMPRESSION: Negative for age CTA Head and Neck; tortuous arteries but minimal atherosclerosis and no significant arterial stenosis. Electronically Signed   By: Odessa Fleming M.D.   On: 07/21/2022 11:21   DG Chest 1 View  Result Date: 07/21/2022 CLINICAL DATA:  82 year old male with history of shortness of breath. Sudden onset of headache. EXAM: CHEST  1 VIEW COMPARISON:  Chest x-ray 07/30/2021. FINDINGS: Lung volumes are normal. Mild diffuse interstitial prominence and peribronchial cuffing, similar to prior studies, likely reflective of mild chronic bronchitis. No consolidative airspace disease. No pleural effusions. No pneumothorax. No pulmonary nodule or mass noted. Pulmonary vasculature and  the cardiomediastinal silhouette are within normal limits. IMPRESSION: 1. No radiographic evidence of acute cardiopulmonary disease. 2. 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 Electronically Signed   By: Trudie Reed M.D.   On: 07/21/2022 07:09   CT Head Wo Contrast  Result Date: 07/21/2022 CLINICAL DATA:  Sudden severe headache. EXAM: CT HEAD WITHOUT CONTRAST TECHNIQUE: Contiguous axial images were obtained from the base of the skull through the vertex without intravenous contrast. RADIATION DOSE REDUCTION: This exam was performed according to the departmental dose-optimization program which includes automated exposure control, adjustment of the mA and/or kV according to patient size and/or use of iterative reconstruction technique. COMPARISON:  Head CT 11/28/2020, MRI brain 02/23/2021. FINDINGS: Brain: There is mild cerebral atrophy, small-vessel disease and atrophic ventriculomegaly, unchanged. The cerebellum and brainstem are unremarkable. No new asymmetry is seen worrisome for acute infarct, hemorrhage or mass. There is no midline shift. The basal cisterns are clear. Vascular: There are scattered calcifications in both siphons. There are no hyperdense central vessels. Skull: Negative for fractures or focal lesions. Sinuses/Orbits: No acute finding. Clear paranasal sinuses and left mastoids. Chronic fluid in the lower right mastoids. Negative orbits aside from old lens extractions. Other: None. IMPRESSION: 1. No acute intracranial CT findings or interval changes. Chronic changes discussed above. 2. Chronic fluid in the lower right mastoids. Electronically Signed   By: Almira Bar M.D.   On: 07/21/2022 06:38    DVT Prophylaxis -SCD /heparin AM Labs Ordered, also please review Full Orders  Family Communication: Admission, patients condition and plan of care including tests being ordered have been discussed with the patient and daughter, older brother and wife who indicate  understanding and agree with the plan    Condition  -stable  Shon Hale M.D on 07/21/2022 at 5:07 PM Go to [email or web address] -  for contact info  [facility] - Office  [phone]

## 2022-07-21 NOTE — Progress Notes (Signed)
Patient came in with sudden headache that he described was different from any other.  [place] that EMS was using was taken off to see baseline for patient on RA.  Sat went down to 89%, placed patient back on 2L for support.  Patient does not wear O2 at home.  Current sat is 95%.

## 2022-07-21 NOTE — Progress Notes (Signed)
*  PRELIMINARY RESULTS* Echocardiogram 2D Echocardiogram has been performed. No subcostal window.  Stacey Drain 07/21/2022, 3:02 PM

## 2022-07-21 NOTE — ED Notes (Signed)
Patient transported to CT 

## 2022-07-21 NOTE — Procedures (Signed)
Patient Name: Andrew Mcgrath  MRN: [id number]  Epilepsy Attending: Charlsie Quest  Referring Physician/Provider: Shon Hale, MD  Date: 07/21/2022 Duration: 22.12 mins  Patient history: 82yo M with transient alteration of awareness. EEG to evaluate for seizure.  Level of alertness: Awake, drowsy  AEDs during EEG study: None  Technical aspects: This EEG study was done with scalp electrodes positioned according to the 10-20 International system of electrode placement. Electrical activity was reviewed with band pass filter of 1-70Hz , sensitivity of 7 uV/mm, display speed of 67mm/sec with a 60Hz  notched filter applied as appropriate. EEG data were recorded continuously and digitally stored.  Video monitoring was available and reviewed as appropriate.  Description: The posterior dominant rhythm consists of 9 Hz activity of moderate voltage (25-35 uV) seen predominantly in posterior head regions, symmetric and reactive to eye opening and eye closing. Drowsiness was characterized by attenuation of the posterior background rhythm. Physiologic photic driving was not seen during photic stimulation.  Hyperventilation was not performed.     IMPRESSION: This study is within normal limits. No seizures or epileptiform discharges were seen throughout the recording.  A normal interictal EEG does not exclude the diagnosis of epilepsy.  Andrew Mcgrath

## 2022-07-21 NOTE — ED Provider Notes (Signed)
[name] AT [facility]  Provider Note  CSN: [id number] Arrival date & time: 07/21/22 0559  History Chief Complaint  Patient presents with   Headache    Andrew Mcgrath is a 82 y.o. male with history of headaches, memory problems, HFimpEF, brought by EMS for sudden onset headache just prior to arrival. The details are not clear, EMS reports he had sudden onset headache while walking back to bed from the bathroom. Patient states his wife 'thought something was wrong with him' and woke up him which is when the headache started. He reports sharp headache, posterior and superior. Reports he has been in his usual state of health recently. Lives independently with wife. He reports this is different from prior headaches. Reports he feels weak all over, but no slurred speech, facial droop, arm or leg weakness. He is not on blood thinners, did not have a fall. Apparently, there was a period of time when he was unresponsive and his wife thought he wasn't breathing which prompted her to call 911, however he was awake and alert on their arrival.    Home Medications Prior to Admission medications   Medication Sig Start Date End Date Taking? Authorizing Provider  aspirin EC 81 MG tablet Take 1 tablet (81 mg total) by mouth daily. 11/09/20   Rehman, Joline Maxcy, MD  atorvastatin (LIPITOR) 20 MG tablet Take 1 tablet (20 mg total) by mouth daily. 12/29/21 04/23/22  Jake Bathe, MD  EPINEPHrine 0.3 mg/0.3 mL IJ SOAJ injection Inject 0.3 mg into the muscle as needed for anaphylaxis (Bee stings).    [provider]  metoprolol succinate (TOPROL-XL) 25 MG 24 hr tablet TAKE (1) TABLET BY MOUTH ONCE DAILY. 01/05/22   Antoine Poche, MD  Multiple Vitamins-Minerals (MULTIVITAMIN WITH MINERALS) tablet Take 1 tablet by mouth daily.    [provider]  pantoprazole (PROTONIX) 20 MG tablet Take 40 mg by mouth daily. 02/25/22   [provider]   sacubitril-valsartan (ENTRESTO) 24-26 MG Take 1 tablet by mouth 2 (two) times daily. 01/27/22   Antoine Poche, MD  testosterone cypionate (DEPOTESTOSTERONE CYPIONATE) 200 MG/ML injection Inject 200 mg into the muscle every 21 ( twenty-one) days.    [provider]     Allergies    Bee venom and Penicillins   Review of Systems   Review of Systems Please see HPI for pertinent positives and negatives  Physical Exam BP 120/75   Pulse (!) 58   Temp 97.7 F (36.5 C) (Oral)   Resp 12   Wt 93 kg   SpO2 94%   BMI 27.05 kg/m   Physical Exam Vitals and nursing note reviewed.  Constitutional:      Appearance: Normal appearance.  HENT:     Head: Normocephalic and atraumatic.     Nose: Nose normal.     Mouth/Throat:     Mouth: Mucous membranes are moist.  Eyes:     Extraocular Movements: Extraocular movements intact.     Conjunctiva/sclera: Conjunctivae normal.  Cardiovascular:     Rate and Rhythm: Normal rate.  Pulmonary:     Effort: Pulmonary effort is normal.     Breath sounds: Normal breath sounds.  Abdominal:     General: Abdomen is flat.     Palpations: Abdomen is soft.     Tenderness: There is no abdominal tenderness.  Musculoskeletal:        General: No swelling. Normal range of motion.  Cervical back: Neck supple.  Skin:    General: Skin is warm and dry.  Neurological:     General: No focal deficit present.     Mental Status: He is alert and oriented to person, place, and time.     Cranial Nerves: No cranial nerve deficit, dysarthria or facial asymmetry.     Sensory: No sensory deficit.     Motor: No weakness.     Comments: No nystagmus  Psychiatric:        Mood and Affect: Mood normal.     ED Results / Procedures / Treatments   EKG EKG Interpretation  Date/Time:  Wednesday July 21 2022 06:18:40 EDT Ventricular Rate:  60 PR Interval:  246 QRS Duration: 169 QT Interval:  499 QTC Calculation: 499 R Axis:   5 Text Interpretation: Sinus  rhythm Prolonged PR interval Left bundle branch block No significant change since last tracing Confirmed by Susy Frizzle [id number]) on 07/21/2022 6:20:46 AM  Procedures Procedures  Medications Ordered in the ED Medications - No data to display  Initial Impression and Plan  Patient here with headache, no focal deficits. Exact details of the sequence of events this morning is not clear. Wife is apparently en route, will speak with her when she arrives. His exam and vitals are reassuring. Will check labs, EKG and send for head CT.   ED Course   Clinical Course as of 07/21/22 0743  Wed Jul 21, 2022  6045 Wife at bedside now states the patient got up to go to the bathroom and a short time after he returned to bed, he began gasping for air, turned purple and would not respond to her. This lasted for a few minutes which was when she called EMS. He quickly returned to normal before EMS arrived and that was when he began complaining of headache. He is feeling better now. No further headache or SOB. His SpO2 was borderline low on arrival but no tachycardia. Will check cardiac labs and Dimer and add CXR to prior workup.  [CS]  [id number] I personally viewed the images from radiology studies and agree with radiologist interpretation: CT head is neg for acute process [CS]  0651 CBC is normal.  [CS]  0702 BMP is unremarkable.  [CS]  [id number] I personally viewed the images from radiology studies and agree with radiologist interpretation: CXR is neg for acute infiltrates  [CS]  0720 Dimer is negative.  [CS]  0730 Initial Trop is normal.  [CS]  0741 Care of the patient signed out to Dr. Rhunette Croft at the change of shift.  [CS]    Clinical Course User Index [CS] Pollyann Savoy, MD     MDM Rules/Calculators/A&P Medical Decision Making Problems Addressed: Acute nonintractable headache, unspecified headache type: acute illness or injury SOB (shortness of breath): acute illness or injury  Amount and/or  Complexity of Data Reviewed Labs: ordered. Decision-making details documented in ED Course. Radiology: ordered and independent interpretation performed. Decision-making details documented in ED Course. ECG/medicine tests: ordered and independent interpretation performed. Decision-making details documented in ED Course.     Final Clinical Impression(s) / ED Diagnoses Final diagnoses:  Acute nonintractable headache, unspecified headache type  SOB (shortness of breath)    Rx / DC Orders ED Discharge Orders     None        Pollyann Savoy, MD 07/21/22 [phone]

## 2022-07-21 NOTE — Plan of Care (Signed)
On call neurology note  Concern for seizure v syncope v vertigo - symptoms resolved.  Recommend Obs, MR brain w/o, CTA head and neck, EEG, tele and syncope work up.  Please call [facility] Neurologist on call to follow in routine consultation.  -- Milon Dikes, MD Neurologist [facility] Pager: [phone]

## 2022-07-22 ENCOUNTER — Other Ambulatory Visit: Payer: Self-pay

## 2022-07-22 DIAGNOSIS — R55 Syncope and collapse: Secondary | ICD-10-CM | POA: Diagnosis not present

## 2022-07-22 LAB — COMPREHENSIVE METABOLIC PANEL
ALT: 18 U/L (ref 0–44)
AST: 18 U/L (ref 15–41)
Albumin: 4 g/dL (ref 3.5–5.0)
Alkaline Phosphatase: 46 U/L (ref 38–126)
Anion gap: 9 (ref 5–15)
BUN: 14 mg/dL (ref 8–23)
CO2: 24 mmol/L (ref 22–32)
Calcium: 9 mg/dL (ref 8.9–10.3)
Chloride: 104 mmol/L (ref 98–111)
Creatinine, Ser: 1.01 mg/dL (ref 0.61–1.24)
GFR, Estimated: >60 mL/min (ref >60)
Glucose, Bld: 93 mg/dL (ref 70–99)
Potassium: 3.8 mmol/L (ref 3.5–5.1)
Sodium: 137 mmol/L (ref 135–145)
Total Bilirubin: 2.1 mg/dL — ABNORMAL HIGH (ref 0.3–1.2)
Total Protein: 6.3 g/dL — ABNORMAL LOW (ref 6.5–8.1)

## 2022-07-22 LAB — CBC
HCT: 47.3 % (ref 39.0–52.0)
Hemoglobin: 16.1 g/dL (ref 13.0–17.0)
MCH: 30.9 pg (ref 26.0–34.0)
MCHC: 34 g/dL (ref 30.0–36.0)
MCV: 90.8 fL (ref 80.0–100.0)
Platelets: 165 10*3/uL (ref 150–400)
RBC: 5.21 MIL/uL (ref 4.22–5.81)
RDW: 14 % (ref 11.5–15.5)
WBC: 6.4 10*3/uL (ref 4.0–10.5)
nRBC: 0 % (ref 0.0–0.2)

## 2022-07-22 MED ORDER — ATORVASTATIN CALCIUM 20 MG PO TABS: 20.0000 mg | ORAL_TABLET | Freq: Every day | ORAL | 2 refills | Status: DC

## 2022-07-22 MED ORDER — ASPIRIN EC 81 MG PO TBEC: 81.0000 mg | DELAYED_RELEASE_TABLET | Freq: Every day | ORAL | 11 refills | Status: AC

## 2022-07-22 NOTE — Discharge Instructions (Signed)
1)Avoid ibuprofen/Advil/Aleve/Motrin/Goody Powders/Naproxen/BC powders/Meloxicam/Diclofenac/Indomethacin and other Nonsteroidal anti-inflammatory medications as these will make you more likely to bleed and can cause stomach ulcers, can also cause Kidney problems.   2) you will receive a 30-day event Heart monitor in the mail--- please follow the instructions included with your heart monitor  3) follow-up to primary care physician in 1 to 2 weeks for recheck

## 2022-07-22 NOTE — Discharge Summary (Signed)
Andrew Mcgrath, is a 82 y.o. male  DOB Nov 20, 1940  MRN [id number].  Admission date:  07/21/2022  Admitting Physician  Shon Hale, MD  Discharge Date:  07/22/2022   Primary MD  Benita Stabile, MD  Recommendations for primary care physician for things to follow:   1)Avoid ibuprofen/Advil/Aleve/Motrin/Goody Powders/Naproxen/BC powders/Meloxicam/Diclofenac/Indomethacin and other Nonsteroidal anti-inflammatory medications as these will make you more likely to bleed and can cause stomach ulcers, can also cause Kidney problems.   2) you will receive a 30-day event Heart monitor in the mail--- please follow the instructions included with your heart monitor  3) follow-up to primary care physician in 1 to 2 weeks for recheck  Admission Diagnosis  Syncope and collapse [R55] SOB (shortness of breath) [R06.02] Acute nonintractable headache, unspecified headache type [R51.9]   Discharge Diagnosis  Syncope and collapse [R55] SOB (shortness of breath) [R06.02] Acute nonintractable headache, unspecified headache type [R51.9]    Principal Problem:   Syncope and collapse Active Problems:   Left bundle branch block      Past Medical History:  Diagnosis Date   Bradycardia    Familial restless leg syndrome    GERD (gastroesophageal reflux disease)    Hyperlipidemia     Past Surgical History:  Procedure Laterality Date   APPENDECTOMY     CARDIAC CATHETERIZATION     COLONOSCOPY     COLONOSCOPY N/A 08/23/2013   Procedure: COLONOSCOPY;  Surgeon: Malissa Hippo, MD;  [facility]: [facility];  Service: Endoscopy;  Laterality: N/A;  1200   COLONOSCOPY N/A 11/06/2020   Procedure: COLONOSCOPY;  Surgeon: Malissa Hippo, MD;  [facility]: [facility];  Service: Endoscopy;  Laterality: N/A;  10:30   POLYPECTOMY  11/06/2020   Procedure: POLYPECTOMY;  Surgeon: Malissa Hippo, MD;  Location: [facility];   Service: Endoscopy;;   TONSILLECTOMY         HPI  from the history and physical done on the day of admission:   Andrew Mcgrath  is a 82 y.o. male past medical history relevant for chronic headaches,, memory problems, HFimpEF , chronic diastolic dysfunction CHF, hypogonadism, HLD and GERD who presents to the ED by EMS with concerns for fall episode of loss of consciousness -Apparently had a sudden onset of headache while walking back from bathroom to bed then became somewhat unresponsive for show wife called EMS -Wife believes patient may have had transient speech problems when he was coming around -He had involuntary movements but no defined seizures per se -Additional history obtained from patient's daughter, wife and brother at bedside -EMS exam: CBG 103, afebrile, 132/80, HR 88, 94% RA, tremors at baseline, no focal neuro deficits  In the ED sat went down to 89%, placed patient back on 2L for support. Patient does not wear O2 at home. Current sat is 95%.  -Patient denies any chest pains palpitations dizziness or prodromal symptoms prior to losing consciousness -No incontinence or tongue biting -EDP discussed case with on-call neurology Dr. Milon Dikes who recommended Obs,  MR brain w/o, CTA head and neck, EEG, tele and syncope work up due to concerns about Concern for Seizure Vs Syncope Vs Vertigo  No fever  Or chills  No Nausea, Vomiting or Diarrhea -Twelve-lead EKG with sinus rhythm,LBBB which is not new, no significant change from prior -EEG without epileptiform findings -Echo with preserved EF 50 to 55%, grade 1 diastolic dysfunction, no mitral or aortic stenosis noted -Chest x-ray without acute findings -MRI brain without acute findings CT angio head and neck without acute findings, no LVO, CT head without contrast acute findings -CBC WNL -Creatinine 1.08 and potassium 3.4   Hospital Course:   1)Syncope- POA -Patient denies any chest pains palpitations dizziness or prodromal  symptoms prior to losing consciousness -No further concerns overnight -Ruled out for ACS by cardiac enzymes and EKG Troponin 11 >> 27,  D-Dimer 0.27 BNP--84.0  --Twelve-lead EKG with sinus rhythm,LBBB which is not new, no significant change from prior -EEG without epileptiform findings -Echo with preserved EF 50 to 55%, grade 1 diastolic dysfunction, no mitral or aortic stenosis noted -Chest x-ray without acute findings -MRI brain without acute findings CT angio head and neck without acute findings, no LVO, CT head without contrast acute findings -No significant arrhythmia on telemetry overnight patient will be discharged home with outpatient Holter/event monitor -On-call neurology input appreciated please see HPI section below   2) combined systolic and diastolic dysfunction CHF/HFimpEF , chronic diastolic dysfunction CHF- -repeat echo from 07/18/2022 as noted above #1 -Continue Entresto and Toprol XL   3) transient hypoxia---In the ED sat went down to 89%, placed patient back on 2L for support. Patient does not wear O2 at home. Current sat is 95%.  On 2 L of oxygen -Hypoxia has resolved -Post ambulation O2 sats 93 to 95% on room air     4)Social/Ethics--- plan of care  discussed with patient's daughter, wife and brother at bedside -Full code   5)HLD--- continue atorvastatin   6)GERD-supple, continue Protonix     Dispo: The patient is from: Home              Anticipated d/c is to: Home  Discharge Condition: Stable  Follow UP   Follow-up Information     Benita Stabile, MD Follow up in 2 week(s).   Specialty: Internal Medicine Contact information: [address] [place] [address] [phone]                 Consults obtained -neurology  Diet and Activity recommendation:  As advised  Discharge Instructions    Discharge Instructions     Call MD for:  difficulty breathing, headache or visual disturbances   Complete by: As directed    Call MD for:   persistant dizziness or light-headedness   Complete by: As directed    Call MD for:  persistant nausea and vomiting   Complete by: As directed    Call MD for:  temperature >100.4   Complete by: As directed    Diet - low sodium heart healthy   Complete by: As directed    Discharge instructions   Complete by: As directed    1)Avoid ibuprofen/Advil/Aleve/Motrin/Goody Powders/Naproxen/BC powders/Meloxicam/Diclofenac/Indomethacin and other Nonsteroidal anti-inflammatory medications as these will make you more likely to bleed and can cause stomach ulcers, can also cause Kidney problems.   2) you will receive a 30-day event Heart monitor in the mail--- please follow the instructions included with your heart monitor  3) follow-up to primary care physician  in 1 to 2 weeks for recheck   Increase activity slowly   Complete by: As directed        Discharge Medications    Allergies as of 07/22/2022       Reactions   Bee Venom Swelling   Penicillins    Pt and spouse state pt is possibly allergic to penicillin both unsure of reaction         Medication List     TAKE these medications    aspirin EC 81 MG tablet Take 1 tablet (81 mg total) by mouth daily with breakfast. What changed: when to take this   atorvastatin 20 MG tablet Commonly known as: LIPITOR Take 1 tablet (20 mg total) by mouth daily.   Entresto 24-26 MG Generic drug: sacubitril-valsartan Take 1 tablet by mouth 2 (two) times daily.   EPINEPHrine 0.3 mg/0.3 mL Soaj injection Commonly known as: EPI-PEN Inject 0.3 mg into the muscle as needed for anaphylaxis (Bee stings).   metoprolol succinate 25 MG 24 hr tablet Commonly known as: TOPROL-XL TAKE (1) TABLET BY MOUTH ONCE DAILY. What changed: See the new instructions.   multivitamin with minerals tablet Take 1 tablet by mouth daily.   pantoprazole 20 MG tablet Commonly known as: PROTONIX Take 40 mg by mouth daily.   testosterone cypionate 200 MG/ML  injection Commonly known as: DEPOTESTOSTERONE CYPIONATE Inject 200 mg into the muscle every 21 ( twenty-one) days.       Major procedures and Radiology Reports - PLEASE review detailed and final reports for all details, in brief -   EEG adult  Result Date: 07/21/2022 Charlsie Quest, MD     07/21/2022  4:58 PM Patient Name: LEEUM SANKEY MRN: [id number] Epilepsy Attending: Charlsie Quest Referring Physician/Provider: Shon Hale, MD Date: 07/21/2022 Duration: 22.12 mins Patient history: 82yo M with transient alteration of awareness. EEG to evaluate for seizure. Level of alertness: Awake, drowsy AEDs during EEG study: None Technical aspects: This EEG study was done with scalp electrodes positioned according to the 10-20 International system of electrode placement. Electrical activity was reviewed with band pass filter of 1-70Hz , sensitivity of 7 uV/mm, display speed of 78mm/sec with a 60Hz  notched filter applied as appropriate. EEG data were recorded continuously and digitally stored.  Video monitoring was available and reviewed as appropriate. Description: The posterior dominant rhythm consists of 9 Hz activity of moderate voltage (25-35 uV) seen predominantly in posterior head regions, symmetric and reactive to eye opening and eye closing. Drowsiness was characterized by attenuation of the posterior background rhythm. Physiologic photic driving was not seen during photic stimulation.  Hyperventilation was not performed.   IMPRESSION: This study is within normal limits. No seizures or epileptiform discharges were seen throughout the recording. A normal interictal EEG does not exclude the diagnosis of epilepsy. Charlsie Quest   ECHOCARDIOGRAM COMPLETE  Result Date: 07/21/2022    ECHOCARDIOGRAM REPORT   Patient Name:   ROCKLAND KOTARSKI Date of Exam: 07/21/2022 Medical Rec #:  [id number]         Height:       73.0 in Accession #:    [id number]        Weight:       198.9 lb Date of Birth:   Mar 23, 1940          BSA:          2.146 m Patient Age:    82 years          BP:  140/73 mmHg Patient Gender: M                 HR:           58 bpm. [facility] Procedure: 2D Echo, Cardiac Doppler and Color Doppler Indications:    Syncope R55  History:        Patient has prior history of Echocardiogram examinations, most                 recent 06/15/2021. CHF, Arrythmias:LBBB; Risk                 Factors:Hypertension, Dyslipidemia and Former Smoker.  Sonographer:    Celesta Gentile RCS Referring Phys: [phone] [facility]  Sonographer Comments: No subcostal window. IMPRESSIONS  1. Left ventricular ejection fraction, by estimation, is 50 to 55%. The left ventricle has low normal function. Left ventricular endocardial border not optimally defined to evaluate regional wall motion. Left ventricular diastolic parameters are consistent with Grade I diastolic dysfunction (impaired relaxation).  2. Right ventricular systolic function is normal. The right ventricular size is normal. Tricuspid regurgitation signal is inadequate for assessing PA pressure.  3. Left atrial size was mildly dilated.  4. The mitral valve is normal in structure. No evidence of mitral valve regurgitation. No evidence of mitral stenosis.  5. The aortic valve was not well visualized. Aortic valve regurgitation is not visualized. No aortic stenosis is present.  6. Aortic dilatation noted. There is mild dilatation of the aortic root, measuring 41 mm. FINDINGS  Left Ventricle: Left ventricular ejection fraction, by estimation, is 50 to 55%. The left ventricle has low normal function. Left ventricular endocardial border not optimally defined to evaluate regional wall motion. The left ventricular internal cavity  size was normal in size. There is no left ventricular hypertrophy. Left ventricular diastolic parameters are consistent with Grade I diastolic dysfunction (impaired relaxation). Right Ventricle: The right ventricular size is  normal. No increase in right ventricular wall thickness. Right ventricular systolic function is normal. Tricuspid regurgitation signal is inadequate for assessing PA pressure. Left Atrium: Left atrial size was mildly dilated. Right Atrium: Right atrial size was normal in size. Pericardium: There is no evidence of pericardial effusion. Mitral Valve: The mitral valve is normal in structure. No evidence of mitral valve regurgitation. No evidence of mitral valve stenosis. Tricuspid Valve: The tricuspid valve is not well visualized. Tricuspid valve regurgitation is not demonstrated. No evidence of tricuspid stenosis. Aortic Valve: The aortic valve was not well visualized. Aortic valve regurgitation is not visualized. No aortic stenosis is present. Pulmonic Valve: The pulmonic valve was not well visualized. Pulmonic valve regurgitation is not visualized. No evidence of pulmonic stenosis. Aorta: Aortic dilatation noted. There is mild dilatation of the aortic root, measuring 41 mm. Venous: The inferior vena cava was not well visualized. IAS/Shunts: The interatrial septum was not well visualized.  LEFT VENTRICLE PLAX 2D LVIDd:         4.90 cm   Diastology LVIDs:         3.40 cm   LV e' medial:    3.48 cm/s LV PW:         1.10 cm   LV E/e' medial:  16.4 LV IVS:        1.10 cm   LV e' lateral:   6.53 cm/s LVOT diam:     2.20 cm   LV E/e' lateral: 8.7 LV SV:         67 LV  SV Index:   31 LVOT Area:     3.80 cm  RIGHT VENTRICLE RV S prime:     12.00 cm/s TAPSE (M-mode): 2.4 cm LEFT ATRIUM           Index        RIGHT ATRIUM           Index LA diam:      3.90 cm 1.82 cm/m   RA Area:     14.10 cm LA Vol (A2C): 68.3 ml 31.82 ml/m  RA Volume:   36.90 ml  17.19 ml/m LA Vol (A4C): 87.8 ml 40.91 ml/m  AORTIC VALVE LVOT Vmax:   80.30 cm/s LVOT Vmean:  59.400 cm/s LVOT VTI:    0.175 m  AORTA Ao Root diam: 4.10 cm MITRAL VALVE MV Area (PHT): 1.75 cm     SHUNTS MV Decel Time: 433 msec     Systemic VTI:  0.18 m MV E velocity: 57.00  cm/s   Systemic Diam: 2.20 cm MV A velocity: 107.00 cm/s MV E/A ratio:  0.53 Vishnu Priya Mallipeddi Electronically signed by Winfield Rast Mallipeddi Signature Date/Time: 07/21/2022/4:44:54 PM    Final    MR BRAIN WO CONTRAST  Result Date: 07/21/2022 CLINICAL DATA:  Transient ischemic attack.  Sudden severe headache. EXAM: MRI HEAD WITHOUT CONTRAST TECHNIQUE: Multiplanar, multiecho pulse sequences of the brain and surrounding structures were obtained without intravenous contrast. COMPARISON:  CT studies earlier same day. FINDINGS: Brain: Diffusion imaging does not show any acute or subacute infarction or other cause of restricted diffusion. No abnormality affects the brainstem or cerebellum. Cerebral hemispheres show mild age related volume loss with very minimal small vessel change of the white matter, less than often seen at this age. No cortical or large vessel territory infarction. No mass, hemorrhage, hydrocephalus or extra-axial collection. Vascular: Major vessels at the base of the brain show flow. Skull and upper cervical spine: Negative Sinuses/Orbits: Paranasal sinuses are clear.  Orbits appear normal. Other: Tiny amount of fluid at the right mastoid tip, probably not significant IMPRESSION: No acute or reversible finding. Mild age related volume loss. Minimal small vessel change of the cerebral hemispheric white matter, less than often seen at this age. Electronically Signed   By: Paulina Fusi M.D.   On: 07/21/2022 11:50   CT ANGIO HEAD NECK W WO CM  Result Date: 07/21/2022 CLINICAL DATA:  82 year old male with sudden severe headache. Seizure versus syncope. EXAM: CT ANGIOGRAPHY HEAD AND NECK WITH AND WITHOUT CONTRAST TECHNIQUE: Multidetector CT imaging of the head and neck was performed using the standard protocol during bolus administration of intravenous contrast. Multiplanar CT image reconstructions and MIPs were obtained to evaluate the vascular anatomy. Carotid stenosis measurements (when  applicable) are obtained utilizing NASCET criteria, using the distal internal carotid diameter as the denominator. RADIATION DOSE REDUCTION: This exam was performed according to the departmental dose-optimization program which includes automated exposure control, adjustment of the mA and/or kV according to patient size and/or use of iterative reconstruction technique. CONTRAST:  75mL OMNIPAQUE IOHEXOL 350 MG/ML SOLN COMPARISON:  Plain head CT 0624 hours today. Brain MRI 02/23/2021. Neck CT 08/10/2017. FINDINGS: CTA NECK Skeleton: Widespread but generally age-appropriate cervical spine degeneration. No acute or suspicious osseous lesion. Partially visible dextroconvex upper thoracic scoliosis. Upper chest: Negative. Other neck: No acute finding. Aortic arch: 3 vessel arch.  Minimal arch atherosclerosis. Right carotid system: Mild brachiocephalic artery plaque without stenosis. Minimal right CCA plaque, mild tortuosity. No significant right carotid bifurcation or  cervical right ICA plaque or stenosis. Left carotid system: Mild left CCA tortuosity, mild combined soft and calcified plaque at the level of the larynx (series 4, image 131). Similar mild soft and calcified plaque at the medial left carotid bifurcation. No significant stenosis to the skull base. Vertebral arteries: Mildly tortuous and calcified proximal right subclavian artery with no stenosis. Normal right vertebral artery origin. Right vertebral artery is patent and within normal limits to the skull base. Minimal proximal left subclavian artery plaque. Normal left vertebral artery origin. Tortuous left V1 segment, partially obscured by adjacent dense venous contrast reflux. But codominant left vertebral artery is otherwise patent and within normal limits to the skull base. CTA HEAD Posterior circulation: Distal vertebral arteries and vertebrobasilar junction are patent with no significant plaque or stenosis. Normal PICA origins. Patent basilar artery  without stenosis. Patent SCA and PCA origins. Posterior communicating arteries are diminutive or absent. Bilateral PCA branches are patent and within normal limits. Anterior circulation: Both ICA siphons are patent. Mildly tortuous bilateral ICA siphons with no significant stenosis, only mild calcified plaque primarily on the right. Patent carotid termini, MCA and ACA origins. Tortuous A1 segments, the right is dominant. Anterior communicating artery and bilateral ACA branches are within normal limits. Left MCA M1 segment and bifurcation are patent without stenosis. Right MCA M1 segment and trifurcation are patent without stenosis. Bilateral MCA branches are within normal limits. Venous sinuses: Patent. Anatomic variants: Dominant right ACA A1. Review of the MIP images confirms the above findings IMPRESSION: Negative for age CTA Head and Neck; tortuous arteries but minimal atherosclerosis and no significant arterial stenosis. Electronically Signed   By: Odessa Fleming M.D.   On: 07/21/2022 11:21   DG Chest 1 View  Result Date: 07/21/2022 CLINICAL DATA:  82 year old male with history of shortness of breath. Sudden onset of headache. EXAM: CHEST  1 VIEW COMPARISON:  Chest x-ray 07/30/2021. FINDINGS: Lung volumes are normal. Mild diffuse interstitial prominence and peribronchial cuffing, similar to prior studies, likely reflective of mild chronic bronchitis. No consolidative airspace disease. No pleural effusions. No pneumothorax. No pulmonary nodule or mass noted. Pulmonary vasculature and the cardiomediastinal silhouette are within normal limits. IMPRESSION: 1. No radiographic evidence of acute cardiopulmonary disease. 2. 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 Electronically Signed   By: Trudie Reed M.D.   On: 07/21/2022 07:09   CT Head Wo Contrast  Result Date: 07/21/2022 CLINICAL DATA:  Sudden severe headache. EXAM: CT HEAD WITHOUT CONTRAST TECHNIQUE: Contiguous axial images were obtained from the base of the skull  through the vertex without intravenous contrast. RADIATION DOSE REDUCTION: This exam was performed according to the departmental dose-optimization program which includes automated exposure control, adjustment of the mA and/or kV according to patient size and/or use of iterative reconstruction technique. COMPARISON:  Head CT 11/28/2020, MRI brain 02/23/2021. FINDINGS: Brain: There is mild cerebral atrophy, small-vessel disease and atrophic ventriculomegaly, unchanged. The cerebellum and brainstem are unremarkable. No new asymmetry is seen worrisome for acute infarct, hemorrhage or mass. There is no midline shift. The basal cisterns are clear. Vascular: There are scattered calcifications in both siphons. There are no hyperdense central vessels. Skull: Negative for fractures or focal lesions. Sinuses/Orbits: No acute finding. Clear paranasal sinuses and left mastoids. Chronic fluid in the lower right mastoids. Negative orbits aside from old lens extractions. Other: None. IMPRESSION: 1. No acute intracranial CT findings or interval changes. Chronic changes discussed above. 2. Chronic fluid in the lower right mastoids. Electronically Signed   By: Mellody Dance  Chesser M.D.   On: 07/21/2022 06:38   US Venous Img Lower Unilateral Right (DVT)  Result Date: 06/24/2022 CLINICAL DATA:  82 year old male with a history of localized swelling EXAM: RIGHT LOWER EXTREMITY VENOUS DOPPLER ULTRASOUND TECHNIQUE: Gray-scale sonography with graded compression, as well as color Doppler and duplex ultrasound were performed to evaluate the lower extremity deep venous systems from the level of the common femoral vein and including the common femoral, femoral, profunda femoral, popliteal and calf veins including the posterior tibial, peroneal and gastrocnemius veins when visible. The superficial great saphenous vein was also interrogated. Spectral Doppler was utilized to evaluate flow at rest and with distal augmentation maneuvers in the common  femoral, femoral and popliteal veins. COMPARISON:  None Available. FINDINGS: Contralateral Common Femoral Vein: Respiratory phasicity is normal and symmetric with the symptomatic side. No evidence of thrombus. Normal compressibility. Common Femoral Vein: No evidence of thrombus. Normal compressibility, respiratory phasicity and response to augmentation. Saphenofemoral Junction: No evidence of thrombus. Normal compressibility and flow on color Doppler imaging. Profunda Femoral Vein: No evidence of thrombus. Normal compressibility and flow on color Doppler imaging. Femoral Vein: No evidence of thrombus. Normal compressibility, respiratory phasicity and response to augmentation. Popliteal Vein: No evidence of thrombus. Normal compressibility, respiratory phasicity and response to augmentation. Calf Veins: No evidence of thrombus. Normal compressibility and flow on color Doppler imaging. Superficial Great Saphenous Vein: No evidence of thrombus. Normal compressibility and flow on color Doppler imaging. Other Findings:  None. IMPRESSION: Directed duplex of the right lower extremity negative for DVT Signed, Yvone Neu. Miachel Roux, RPVI [facility] Electronically Signed   By: Gilmer Mor D.O.   On: 06/24/2022 15:31    Today   Subjective    Trilby Leaver today has no new concerns -Wife at bedside, questions answered = Ambulating around the room, no chest pains no palpitations no dizziness no dyspnea -Hypoxia has resolved -Post ambulation O2 sats 93 to 95% on room air   Patient has been seen and examined prior to discharge   Objective   Blood pressure 131/80, pulse (!) 55, temperature 98.4 F (36.9 C), temperature source Oral, resp. rate 17, height 6\' 1"  (1.854 m), weight 90.2 kg, SpO2 91 %.   Intake/Output Summary (Last 24 hours) at 07/22/2022 0949 Last data filed at 07/21/2022 1700 Gross per 24 hour  Intake 480 ml  Output --  Net 480  ml    Exam Gen:- Awake Alert, no acute distress  HEENT:- [place].AT, No sclera icterus Neck-Supple Neck,No JVD,.  Lungs-  CTAB , good air movement bilaterally CV- S1, S2 normal, regular Abd-  +ve B.Sounds, Abd Soft, No tenderness,    Extremity/Skin:- No  edema,   good pulses Psych-affect is appropriate, oriented x3 Neuro-no new focal deficits, no tremors    Data Review   CBC w Diff:  Lab Results  Component Value Date   WBC 6.4 07/22/2022   HGB 16.1 07/22/2022   HCT 47.3 07/22/2022   PLT 165 07/22/2022   LYMPHOPCT 41 07/21/2022   MONOPCT 6 07/21/2022   EOSPCT 2 07/21/2022   BASOPCT 1 07/21/2022    CMP:  Lab Results  Component Value Date   NA 137 07/22/2022   K 3.8 07/22/2022   CL 104 07/22/2022   CO2 24 07/22/2022   BUN 14 07/22/2022   CREATININE 1.01 07/22/2022   PROT 6.3 (L) 07/22/2022   ALBUMIN 4.0 07/22/2022   BILITOT 2.1 (H) 07/22/2022   ALKPHOS 46  07/22/2022   AST 18 07/22/2022   ALT 18 07/22/2022  . Total Discharge time is about 33 minutes  Shon Hale M.D on 07/22/2022 at 9:49 AM  Go to [email or web address] -  for contact info  [facility] - Office  [phone]

## 2022-07-22 NOTE — Progress Notes (Signed)
Patient rested through the night, no acute overnight events.  

## 2022-07-22 NOTE — Progress Notes (Signed)
30 day monitor ordered per Randall An, PA-C and [facility]- Dr. Marisa Severin.   Pt is a established patient with Dr. Wyline Mood at [facility].

## 2022-07-28 ENCOUNTER — Ambulatory Visit: Payer: Medicare HMO | Attending: Cardiology

## 2022-07-28 DIAGNOSIS — R55 Syncope and collapse: Secondary | ICD-10-CM | POA: Diagnosis not present

## 2022-07-28 NOTE — Progress Notes (Signed)
30 day event monitor applied.

## 2022-08-02 ENCOUNTER — Ambulatory Visit: Payer: Medicare HMO | Attending: Cardiology

## 2022-08-02 DIAGNOSIS — R55 Syncope and collapse: Secondary | ICD-10-CM

## 2022-08-03 DIAGNOSIS — Z713 Dietary counseling and surveillance: Secondary | ICD-10-CM | POA: Diagnosis not present

## 2022-08-03 DIAGNOSIS — N182 Chronic kidney disease, stage 2 (mild): Secondary | ICD-10-CM | POA: Diagnosis not present

## 2022-08-03 DIAGNOSIS — R55 Syncope and collapse: Secondary | ICD-10-CM | POA: Diagnosis not present

## 2022-08-03 DIAGNOSIS — E782 Mixed hyperlipidemia: Secondary | ICD-10-CM | POA: Diagnosis not present

## 2022-08-03 DIAGNOSIS — Z79899 Other long term (current) drug therapy: Secondary | ICD-10-CM | POA: Diagnosis not present

## 2022-08-03 DIAGNOSIS — I5022 Chronic systolic (congestive) heart failure: Secondary | ICD-10-CM | POA: Diagnosis not present

## 2022-08-03 DIAGNOSIS — M6281 Muscle weakness (generalized): Secondary | ICD-10-CM | POA: Diagnosis not present

## 2022-08-03 DIAGNOSIS — I130 Hypertensive heart and chronic kidney disease with heart failure and stage 1 through stage 4 chronic kidney disease, or unspecified chronic kidney disease: Secondary | ICD-10-CM | POA: Diagnosis not present

## 2022-08-03 DIAGNOSIS — R0902 Hypoxemia: Secondary | ICD-10-CM | POA: Diagnosis not present

## 2022-08-03 DIAGNOSIS — K219 Gastro-esophageal reflux disease without esophagitis: Secondary | ICD-10-CM | POA: Diagnosis not present

## 2022-08-03 DIAGNOSIS — Z6826 Body mass index (BMI) 26.0-26.9, adult: Secondary | ICD-10-CM | POA: Diagnosis not present

## 2022-08-03 DIAGNOSIS — R253 Fasciculation: Secondary | ICD-10-CM | POA: Diagnosis not present

## 2022-08-04 ENCOUNTER — Telehealth: Payer: Self-pay | Admitting: *Deleted

## 2022-08-04 NOTE — Telephone Encounter (Signed)
Pt denied for [employer] Sherryll Burger) patient assistance d/t Household income exceeding program limits.

## 2022-08-05 ENCOUNTER — Telehealth: Payer: Self-pay | Admitting: Cardiology

## 2022-08-05 DIAGNOSIS — I459 Conduction disorder, unspecified: Secondary | ICD-10-CM

## 2022-08-05 NOTE — Telephone Encounter (Signed)
Critical event 1033 cst. 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 Pt did not report any sx. Will print critical report when available.  Please advise.

## 2022-08-05 NOTE — Telephone Encounter (Signed)
Andrew Mcgrath calling from [employer] to state the pt's cardiac event and would like a callback. Please advise.

## 2022-08-05 NOTE — Telephone Encounter (Signed)
[employer] calling with event on patient monitor.

## 2022-08-05 NOTE — Telephone Encounter (Signed)
Left a message for patient to call office back.   EP referral entered per providers request. Message sent to schedulers to schedule referral for patient to be seen in the [place] office with Dr. Ladona Ridgel.

## 2022-08-09 NOTE — Telephone Encounter (Signed)
 Patient notified and verbalized understanding. 

## 2022-08-25 ENCOUNTER — Telehealth: Payer: Self-pay | Admitting: Internal Medicine

## 2022-08-25 NOTE — Telephone Encounter (Signed)
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  Called patient and discussed findings with him directly.  He was comfortably watching TV and denied any active chest pain, exertional chest pressure/discomfort, dyspnea/tachypnea, paroxysmal nocturnal dyspnea/orthopnea, irregular heart beat/palpitations, presyncope/syncope.  He checked his heart rate with his Fitbit and said that it was in the 60s.  No other complaints or concerns at this time.  Respiratory guidance and ED precautions were given.  Patient voiced full understanding.

## 2022-08-26 ENCOUNTER — Ambulatory Visit: Payer: Medicare HMO | Attending: Internal Medicine | Admitting: Internal Medicine

## 2022-08-26 ENCOUNTER — Other Ambulatory Visit (HOSPITAL_COMMUNITY)
Admission: RE | Admit: 2022-08-26 | Discharge: 2022-08-26 | Disposition: A | Discharge status: Home / Self Care | Payer: Medicare HMO | Source: Ambulatory Visit | Attending: Internal Medicine | Admitting: Internal Medicine

## 2022-08-26 VITALS — BP 120/70 | HR 63 | Ht 73.0 in | Wt 207.0 lb

## 2022-08-26 DIAGNOSIS — Z01818 Encounter for other preprocedural examination: Secondary | ICD-10-CM | POA: Diagnosis not present

## 2022-08-26 DIAGNOSIS — R001 Bradycardia, unspecified: Secondary | ICD-10-CM | POA: Diagnosis not present

## 2022-08-26 LAB — CBC
HCT: 47.3 % (ref 39.0–52.0)
Hemoglobin: 16 g/dL (ref 13.0–17.0)
MCH: 31.2 pg (ref 26.0–34.0)
MCHC: 33.8 g/dL (ref 30.0–36.0)
MCV: 92.2 fL (ref 80.0–100.0)
Platelets: 164 10*3/uL (ref 150–400)
RBC: 5.13 MIL/uL (ref 4.22–5.81)
RDW: 14.3 % (ref 11.5–15.5)
WBC: 6.2 10*3/uL (ref 4.0–10.5)
nRBC: 0 % (ref 0.0–0.2)

## 2022-08-26 LAB — BASIC METABOLIC PANEL
Anion gap: 5 (ref 5–15)
BUN: 13 mg/dL (ref 8–23)
CO2: 32 mmol/L (ref 22–32)
Calcium: 9.3 mg/dL (ref 8.9–10.3)
Chloride: 102 mmol/L (ref 98–111)
Creatinine, Ser: 1.11 mg/dL (ref 0.61–1.24)
GFR, Estimated: >60 mL/min (ref >60)
Glucose, Bld: 120 mg/dL — ABNORMAL HIGH (ref 70–99)
Potassium: 4.8 mmol/L (ref 3.5–5.1)
Sodium: 139 mmol/L (ref 135–145)

## 2022-08-26 NOTE — Progress Notes (Signed)
HPI [name] is referred by Dr. Wyline Mood for evaluation of heart block. 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 He wore a cardiac monitor which demonstrated CHB and he is referred for evaluation. The patient has had one episode of syncope. He was found on his cardiac monitor to have CHB during the daytime. He has periods where he feels bad.  Allergies  Allergen Reactions   Bee Venom Swelling   Penicillins     Pt and spouse state pt is possibly allergic to penicillin both unsure of reaction      Current Outpatient Medications  Medication Sig Dispense Refill   aspirin EC 81 MG tablet Take 1 tablet (81 mg total) by mouth daily with breakfast. 30 tablet 11   atorvastatin (LIPITOR) 20 MG tablet Take 1 tablet (20 mg total) by mouth daily. 90 tablet 2   Cholecalciferol (D3) 25 MCG (1000 UT) capsule Take 1,000 Units by mouth daily.     EPINEPHrine 0.3 mg/0.3 mL IJ SOAJ injection Inject 0.3 mg into the muscle as needed for anaphylaxis (Bee stings).     metoprolol succinate (TOPROL-XL) 25 MG 24 hr tablet TAKE (1) TABLET BY MOUTH ONCE DAILY. (Patient taking differently: Take 25 mg by mouth daily.) 90 tablet 2   Multiple Vitamins-Minerals (MULTIVITAMIN WITH MINERALS) tablet Take 1 tablet by mouth daily.     pantoprazole (PROTONIX) 20 MG tablet Take 40 mg by mouth daily.     sacubitril-valsartan (ENTRESTO) 24-26 MG Take 1 tablet by mouth 2 (two) times daily. 60 tablet 6   testosterone cypionate (DEPOTESTOSTERONE CYPIONATE) 200 MG/ML injection Inject 200 mg into the muscle every 21 ( twenty-one) days.     vitamin B-12 (CYANOCOBALAMIN) 100 MCG tablet Take 100 mcg by mouth daily.     No current facility-administered medications for this visit.     Past Medical History:  Diagnosis Date   Bradycardia    Familial restless leg syndrome    GERD (gastroesophageal reflux disease)    Hyperlipidemia     ROS:   All systems reviewed and negative except as noted in the  HPI.   Past Surgical History:  Procedure Laterality Date   APPENDECTOMY     CARDIAC CATHETERIZATION     COLONOSCOPY     COLONOSCOPY N/A 08/23/2013   Procedure: COLONOSCOPY;  Surgeon: Malissa Hippo, MD;  [facility];  Service: Endoscopy;  Laterality: N/A;  1200   COLONOSCOPY N/A 11/06/2020   Procedure: COLONOSCOPY;  Surgeon: Malissa Hippo, MD;  [facility];  Service: Endoscopy;  Laterality: N/A;  10:30   POLYPECTOMY  11/06/2020   Procedure: POLYPECTOMY;  Surgeon: Malissa Hippo, MD;  [facility];  Service: Endoscopy;;   TONSILLECTOMY       Family History  Problem Relation Age of Onset   Heart failure Father    Diabetes Daughter    Diabetes Son    Colon cancer Neg Hx      Social History   Socioeconomic History   Marital status: Married    Spouse name: Not on file   Number of children: 3   Years of education: 12   Highest education level: Not on file  Occupational History   Not on file  Tobacco Use   Smoking status: Former    Current packs/day: 1.50    Average packs/day: 1.5 packs/day for 20.0 years (30.0 ttl pk-yrs)    Types: Cigarettes  Smokeless tobacco: Never  Vaping Use   Vaping status: Never Used  Substance and Sexual Activity   Alcohol use: Yes    Comment: wine   Drug use: No   Sexual activity: Not on file  Other Topics Concern   Not on file  Social History Narrative   Right handed   Drinks caffeine    2 story home   Social Determinants of Health   Financial Resource Strain: Not on file  Food Insecurity: No Food Insecurity (07/21/2022)   Hunger Vital Sign    Worried About Running Out of Food in the Last Year: Never true    Ran Out of Food in the Last Year: Never true  Transportation Needs: No Transportation Needs (07/21/2022)   PRAPARE - Administrator, Civil Service (Medical): No    Lack of Transportation (Non-Medical): No  Physical Activity: Not on file  Stress: Not on file  Social  Connections: Not on file  Intimate Partner Violence: Not At Risk (07/21/2022)   Humiliation, Afraid, Rape, and Kick questionnaire    Fear of Current or Ex-Partner: No    Emotionally Abused: No    Physically Abused: No    Sexually Abused: No     BP 120/70 (BP Location: Left Arm, Patient Position: Sitting, Cuff Size: Normal)   Pulse 63   Ht 6\' 1"  (1.854 m)   Wt 207 lb (93.9 kg)   SpO2 94%   BMI 27.31 kg/m   Physical Exam:  Well appearing NAD HEENT: Unremarkable Neck:  No JVD, no thyromegally Lymphatics:  No adenopathy Back:  No CVA tenderness Lungs:  Clear HEART:  Regular rate rhythm, no murmurs, no rubs, no clicks Abd:  soft, positive bowel sounds, no organomegally, no rebound, no guarding Ext:  2 plus pulses, no edema, no cyanosis, no clubbing Skin:  No rashes no nodules Neuro:  CN II through XII intact, motor grossly intact  EKG - nsr with lbbb  Assess/Plan: 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 He will continue his current meds. I would anticipate adding coreg and stopping toprol.  Sharlot Gowda Mazella Deen,MD

## 2022-08-26 NOTE — H&P (View-Only) (Signed)
 HPI [name] is referred by Dr. Wyline Mcgrath for evaluation of heart block. 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 He wore a cardiac monitor which demonstrated CHB and he is referred for evaluation. The patient has had one episode of syncope. He was found on his cardiac monitor to have CHB during the daytime. He has periods where he feels bad.  Allergies  Allergen Reactions   Bee Venom Swelling   Penicillins     Pt and spouse state pt is possibly allergic to penicillin both unsure of reaction      Current Outpatient Medications  Medication Sig Dispense Refill   aspirin EC 81 MG tablet Take 1 tablet (81 mg total) by mouth daily with breakfast. 30 tablet 11   atorvastatin (LIPITOR) 20 MG tablet Take 1 tablet (20 mg total) by mouth daily. 90 tablet 2   Cholecalciferol (D3) 25 MCG (1000 UT) capsule Take 1,000 Units by mouth daily.     EPINEPHrine 0.3 mg/0.3 mL IJ SOAJ injection Inject 0.3 mg into the muscle as needed for anaphylaxis (Bee stings).     metoprolol succinate (TOPROL-XL) 25 MG 24 hr tablet TAKE (1) TABLET BY MOUTH ONCE DAILY. (Patient taking differently: Take 25 mg by mouth daily.) 90 tablet 2   Multiple Vitamins-Minerals (MULTIVITAMIN WITH MINERALS) tablet Take 1 tablet by mouth daily.     pantoprazole (PROTONIX) 20 MG tablet Take 40 mg by mouth daily.     sacubitril-valsartan (ENTRESTO) 24-26 MG Take 1 tablet by mouth 2 (two) times daily. 60 tablet 6   testosterone cypionate (DEPOTESTOSTERONE CYPIONATE) 200 MG/ML injection Inject 200 mg into the muscle every 21 ( twenty-one) days.     vitamin B-12 (CYANOCOBALAMIN) 100 MCG tablet Take 100 mcg by mouth daily.     No current facility-administered medications for this visit.     Past Medical History:  Diagnosis Date   Bradycardia    Familial restless leg syndrome    GERD (gastroesophageal reflux disease)    Hyperlipidemia     ROS:   All systems reviewed and negative except as noted in the  HPI.   Past Surgical History:  Procedure Laterality Date   APPENDECTOMY     CARDIAC CATHETERIZATION     COLONOSCOPY     COLONOSCOPY N/A 08/23/2013   Procedure: COLONOSCOPY;  Surgeon: Malissa Hippo, MD;  [facility];  Service: Endoscopy;  Laterality: N/A;  1200   COLONOSCOPY N/A 11/06/2020   Procedure: COLONOSCOPY;  Surgeon: Malissa Hippo, MD;  [facility];  Service: Endoscopy;  Laterality: N/A;  10:30   POLYPECTOMY  11/06/2020   Procedure: POLYPECTOMY;  Surgeon: Malissa Hippo, MD;  [facility];  Service: Endoscopy;;   TONSILLECTOMY       Family History  Problem Relation Age of Onset   Heart failure Father    Diabetes Daughter    Diabetes Son    Colon cancer Neg Hx      Social History   Socioeconomic History   Marital status: Married    Spouse name: Not on file   Number of children: 3   Years of education: 12   Highest education level: Not on file  Occupational History   Not on file  Tobacco Use   Smoking status: Former    Current packs/day: 1.50    Average packs/day: 1.5 packs/day for 20.0 years (30.0 ttl pk-yrs)    Types: Cigarettes  Smokeless tobacco: Never  Vaping Use   Vaping status: Never Used  Substance and Sexual Activity   Alcohol use: Yes    Comment: wine   Drug use: No   Sexual activity: Not on file  Other Topics Concern   Not on file  Social History Narrative   Right handed   Drinks caffeine    2 story home   Social Determinants of Health   Financial Resource Strain: Not on file  Food Insecurity: No Food Insecurity (07/21/2022)   Hunger Vital Sign    Worried About Running Out of Food in the Last Year: Never true    Ran Out of Food in the Last Year: Never true  Transportation Needs: No Transportation Needs (07/21/2022)   PRAPARE - Administrator, Civil Service (Medical): No    Lack of Transportation (Non-Medical): No  Physical Activity: Not on file  Stress: Not on file  Social  Connections: Not on file  Intimate Partner Violence: Not At Risk (07/21/2022)   Humiliation, Afraid, Rape, and Kick questionnaire    Fear of Current or Ex-Partner: No    Emotionally Abused: No    Physically Abused: No    Sexually Abused: No     BP 120/70 (BP Location: Left Arm, Patient Position: Sitting, Cuff Size: Normal)   Pulse 63   Ht 6\' 1"  (1.854 m)   Wt 207 lb (93.9 kg)   SpO2 94%   BMI 27.31 kg/m   Physical Exam:  Well appearing NAD HEENT: Unremarkable Neck:  No JVD, no thyromegally Lymphatics:  No adenopathy Back:  No CVA tenderness Lungs:  Clear HEART:  Regular rate rhythm, no murmurs, no rubs, no clicks Abd:  soft, positive bowel sounds, no organomegally, no rebound, no guarding Ext:  2 plus pulses, no edema, no cyanosis, no clubbing Skin:  No rashes no nodules Neuro:  CN II through XII intact, motor grossly intact  EKG - nsr with lbbb  Assess/Plan: 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 He will continue his current meds. I would anticipate adding coreg and stopping toprol.  Andrew Gowda Taylor,MD

## 2022-08-26 NOTE — Patient Instructions (Signed)
Medication Instructions:  Your physician recommends that you continue on your current medications as directed. Please refer to the Current Medication list given to you today.   *If you need a refill on your cardiac medications before your next appointment, please call your pharmacy*   Lab Work: Your physician recommends that you return for lab work in: Today ( CBC, BMET)    If you have labs (blood work) drawn today and your tests are completely normal, you will receive your results only by: MyChart Message (if you have MyChart) OR A paper copy in the mail If you have any lab test that is abnormal or we need to change your treatment, we will call you to review the results.   Testing/Procedures: Your physician has recommended that you have a pacemaker inserted. A pacemaker is a small device that is placed under the skin of your chest or abdomen to help control abnormal heart rhythms. This device uses electrical pulses to prompt the heart to beat at a normal rate. Pacemakers are used to treat heart rhythms that are too slow. Wire (leads) are attached to the pacemaker that goes into the chambers of you heart. This is done in the hospital and usually requires and overnight stay. Please see the instruction sheet given to you today for more information.    Follow-Up: At [facility], you and your health needs are our priority.  As part of our continuing mission to provide you with exceptional heart care, we have created designated Provider Care Teams.  These Care Teams include your primary Cardiologist (physician) and Advanced Practice Providers (APPs -  Physician Assistants and Nurse Practitioners) who all work together to provide you with the care you need, when you need it.  We recommend signing up for the patient portal called "MyChart".  Sign up information is provided on this After Visit Summary.  MyChart is used to connect with patients for Virtual Visits (Telemedicine).  Patients are  able to view lab/test results, encounter notes, upcoming appointments, etc.  Non-urgent messages can be sent to your provider as well.   To learn more about what you can do with MyChart, go to [email or web address].    Your next appointment:   3 month(s)  Provider:   Lewayne Bunting, MD    Other Instructions Thank you for choosing [facility]!      Implantable Device Instructions    GIN RUBLEY  08/26/2022  You are scheduled for a Biventricular permanent transvenous pacemaker on Monday, August 12 with Dr. Lewayne Bunting.  1. Pre procedure Lab testing:  [facility] 08/26/22     2. Please arrive at the Main Entrance A at [facility]: [address] [place], [place] [address] on August 12 at 5:30 AM (This time is two hours before your procedure to ensure your preparation). Free valet parking service is available. You will check in at ADMITTING. The support person will be asked to wait in the waiting room.  It is OK to have someone drop you off and come back when you are ready to be discharged.        Special note: Every effort is made to have your procedure done on time. Please understand that emergencies sometimes delay  scheduled procedures.  3.  No eating or drinking after midnight prior to procedure.     4.  Medication instructions:  On the morning of your procedure you may take all of your medications    5.  The night before your procedure and the morning of your procedure scrub your neck/chest with CHG surgical scrub.  See instruction letter.  6. Plan to go home the same day, you will only stay overnight if medically necessary. 7.  You MUST have a responsible adult to drive you home. 8.   An adult MUST be with you the first 24 hours after you arrive home. 9..  Bring a current list of your medications, and the last time and date medication taken. 10. Bring ID and current insurance cards. 11. .Please wear clothes that are easy to get on and off and  wear slip-on shoes.    You will follow up with the [facility] Device clinic 10-14 days after your procedure.  You will follow up with Dr. Lewayne Bunting 91 days after your procedure.  These appointments will be made for you.   * If you have ANY questions after you get home, please call the office at [phone] or send a MyChart message.  FYI: For your safety, and to allow [place] to monitor your vital signs accurately during the surgery/procedure we request that if you have artificial nails, gel coating, SNS etc. Please have those removed prior to your surgery/procedure. Not having the nail coverings /polish removed may result in cancellation or delay of your surgery/procedure.    [place] - Preparing For Surgery    Before surgery, you can play an important role. Because skin is not sterile, your skin needs to be as free of germs as possible. You can reduce the number of germs on your skin by washing with CHG (chlorahexidine gluconate) Soap before surgery.  CHG is an antiseptic cleaner which kills germs and bonds with the skin to continue killing germs even after washing.  Please do not use if you have an allergy to CHG or antibacterial soaps.  If your skin becomes reddened/irritated stop using the CHG.   Do not shave (including legs and underarms) for at least 48 hours prior to first CHG shower.  It is OK to shave your face.  Please follow these instructions carefully:  1.  Shower the night before surgery and the morning of surgery with CHG.  2.  If you choose to wash your hair, wash your hair first as usual with your normal shampoo.  3.  After you shampoo, rinse your hair and body thoroughly to remove the shampoo.  4.  Use CHG as you would any other liquid soap.  You can apply CHG directly to the skin and wash gently with a clean washcloth. 5.  Apply the CHG Soap to your body ONLY FROM THE NECK DOWN.  Do not use on open wounds or open sores.  Avoid contact with your eyes, ears, mouth and  genitals (private parts).  Wash genitals (private parts) with your normal soap.  6.  Wash thoroughly, paying special attention to the area where your surgery will be performed.  7.  Thoroughly rinse your body with warm water from the neck down.   8.  DO NOT shower/wash with your normal soap after using and rinsing off the CHG soap.  9.  Pat yourself dry with a clean towel.           10.  Wear clean pajamas.           11.  Place clean sheets on your bed the night of your first shower and do not sleep with pets.  Day of Surgery: Do not apply any deodorants/lotions.  Please wear clean clothes to the hospital/surgery center.

## 2022-09-06 ENCOUNTER — Ambulatory Visit (HOSPITAL_COMMUNITY): Payer: Medicare HMO

## 2022-09-06 ENCOUNTER — Other Ambulatory Visit: Payer: Self-pay

## 2022-09-06 ENCOUNTER — Ambulatory Visit (HOSPITAL_COMMUNITY)
Admission: RE | Admit: 2022-09-06 | Discharge: 2022-09-06 | Disposition: A | Discharge status: Home / Self Care | Payer: Medicare HMO | Attending: Internal Medicine | Admitting: Internal Medicine

## 2022-09-06 ENCOUNTER — Encounter (HOSPITAL_COMMUNITY)
Admission: RE | Disposition: A | Discharge status: Home / Self Care | Payer: Self-pay | Source: Home / Self Care | Attending: Internal Medicine

## 2022-09-06 DIAGNOSIS — Z79899 Other long term (current) drug therapy: Secondary | ICD-10-CM | POA: Insufficient documentation

## 2022-09-06 DIAGNOSIS — I442 Atrioventricular block, complete: Secondary | ICD-10-CM | POA: Diagnosis not present

## 2022-09-06 DIAGNOSIS — I447 Left bundle-branch block, unspecified: Secondary | ICD-10-CM | POA: Diagnosis not present

## 2022-09-06 DIAGNOSIS — J9811 Atelectasis: Secondary | ICD-10-CM | POA: Diagnosis not present

## 2022-09-06 DIAGNOSIS — Z950 Presence of cardiac pacemaker: Secondary | ICD-10-CM | POA: Diagnosis not present

## 2022-09-06 DIAGNOSIS — I5043 Acute on chronic combined systolic (congestive) and diastolic (congestive) heart failure: Secondary | ICD-10-CM | POA: Diagnosis not present

## 2022-09-06 HISTORY — PX: PACEMAKER IMPLANT: EP1218

## 2022-09-06 SURGERY — PACEMAKER IMPLANT

## 2022-09-06 MED ORDER — ONDANSETRON HCL 4 MG/2ML IJ SOLN: 4.0000 mg | Freq: Four times a day (QID) | INTRAMUSCULAR | Status: DC | PRN

## 2022-09-06 MED ORDER — CEFAZOLIN SODIUM-DEXTROSE 1-4 GM/50ML-% IV SOLN
1.0000 g | Freq: Once | INTRAVENOUS | Status: AC
  Administered 2022-09-06: 1 g via INTRAVENOUS
  Filled 2022-09-06: qty 50

## 2022-09-06 MED ORDER — MIDAZOLAM HCL 5 MG/5ML IJ SOLN
INTRAMUSCULAR | Status: AC
  Filled 2022-09-06: qty 5

## 2022-09-06 MED ORDER — FENTANYL CITRATE (PF) 100 MCG/2ML IJ SOLN
INTRAMUSCULAR | Status: DC | PRN
  Administered 2022-09-06 (×3): 12.5 ug via INTRAVENOUS

## 2022-09-06 MED ORDER — CHLORHEXIDINE GLUCONATE 4 % EX SOLN
4.0000 | Freq: Once | CUTANEOUS | Status: DC
  Administered 2022-09-06: 4 via TOPICAL
  Filled 2022-09-06: qty 60

## 2022-09-06 MED ORDER — LIDOCAINE HCL (PF) 1 % IJ SOLN
INTRAMUSCULAR | Status: AC
  Filled 2022-09-06: qty 30

## 2022-09-06 MED ORDER — FENTANYL CITRATE (PF) 100 MCG/2ML IJ SOLN
INTRAMUSCULAR | Status: AC
  Filled 2022-09-06: qty 2

## 2022-09-06 MED ORDER — CEFAZOLIN SODIUM-DEXTROSE 2-4 GM/100ML-% IV SOLN
INTRAVENOUS | Status: AC
  Filled 2022-09-06: qty 100

## 2022-09-06 MED ORDER — MIDAZOLAM HCL 5 MG/5ML IJ SOLN
INTRAMUSCULAR | Status: DC | PRN
  Administered 2022-09-06 (×3): 1 mg via INTRAVENOUS

## 2022-09-06 MED ORDER — POVIDONE-IODINE 10 % EX SWAB
2.0000 | Freq: Once | CUTANEOUS | Status: AC
  Administered 2022-09-06: 2 via TOPICAL

## 2022-09-06 MED ORDER — SODIUM CHLORIDE 0.9 % IV SOLN
80.0000 mg | INTRAVENOUS | Status: AC
  Administered 2022-09-06: 80 mg

## 2022-09-06 MED ORDER — SODIUM CHLORIDE 0.9 % IV SOLN: INTRAVENOUS | Status: DC

## 2022-09-06 MED ORDER — ACETAMINOPHEN 325 MG PO TABS: 325.0000 mg | ORAL_TABLET | ORAL | Status: DC | PRN

## 2022-09-06 MED ORDER — SODIUM CHLORIDE 0.9 % IV SOLN
INTRAVENOUS | Status: AC
  Filled 2022-09-06: qty 2

## 2022-09-06 MED ORDER — LIDOCAINE HCL (PF) 1 % IJ SOLN
INTRAMUSCULAR | Status: AC
  Filled 2022-09-06: qty 90

## 2022-09-06 MED ORDER — LIDOCAINE HCL (PF) 1 % IJ SOLN
INTRAMUSCULAR | Status: DC | PRN
  Administered 2022-09-06: 50 mL

## 2022-09-06 MED ORDER — CEFAZOLIN SODIUM-DEXTROSE 2-4 GM/100ML-% IV SOLN
2.0000 g | INTRAVENOUS | Status: AC
  Administered 2022-09-06: 2 g via INTRAVENOUS

## 2022-09-06 MED ORDER — HEPARIN (PORCINE) IN NACL 1000-0.9 UT/500ML-% IV SOLN
INTRAVENOUS | Status: DC | PRN
  Administered 2022-09-06: 500 mL

## 2022-09-06 SURGICAL SUPPLY — 13 items
CABLE SURGICAL S-101-97-12 (CABLE) ×2 IMPLANT
CATH RIGHTSITE C315HIS02 (CATHETERS) IMPLANT
IPG PACE AZUR XT DR MRI W1DR01 (Pacemaker) IMPLANT
LEAD CAPSURE NOVUS 5076-52CM (Lead) IMPLANT
LEAD SELECT SECURE 3830 383069 (Lead) IMPLANT
MAT PREVALON FULL STRYKER (MISCELLANEOUS) IMPLANT
PACE AZURE XT DR MRI W1DR01 (Pacemaker) ×1 IMPLANT
PAD DEFIB RADIO PHYSIO CONN (PAD) ×2 IMPLANT
SELECT SECURE 3830 383069 (Lead) ×1 IMPLANT
SHEATH 7FR PRELUDE SNAP 13 (SHEATH) IMPLANT
SLITTER 6232ADJ (MISCELLANEOUS) IMPLANT
TRAY PACEMAKER INSERTION (PACKS) ×2 IMPLANT
WIRE HI TORQ VERSACORE-J 145CM (WIRE) IMPLANT

## 2022-09-06 NOTE — Discharge Instructions (Signed)
After Your Pacemaker   You have a [employer] Pacemaker  ACTIVITY Do not lift your arm above shoulder height for 1 week after your procedure. After 7 days, you may progress as below.  You should remove your sling 24 hours after your procedure, unless otherwise instructed by your provider.     Monday September 13, 2022  Tuesday September 14, 2022 Wednesday September 15, 2022 Thursday September 16, 2022   Do not lift, push, pull, or carry anything over 10 pounds with the affected arm until 6 weeks (Monday October 18, 2022 ) after your procedure.   You may drive AFTER your wound check, unless you have been told otherwise by your provider.   Ask your healthcare provider when you can go back to work   INCISION/Dressing If you are on a blood thinner such as Coumadin, Xarelto, Eliquis, Plavix, or Pradaxa please confirm with your provider when this should be resumed.   If large square, outer bandage is left in place, this can be removed after 24 hours from your procedure. Do not remove steri-strips or glue as below.   If a PRESSURE DRESSING (a bulky dressing that usually goes up over your shoulder) was applied or left in place, please follow instructions given by your provider on when to return to have this removed.   Monitor your Pacemaker site for redness, swelling, and drainage. Call the device clinic at [phone] if you experience these symptoms or fever/chills.  If your incision is closed with Dermabond/Surgical glue. You may shower 1 day after your pacemaker implant and wash around the site with soap and water.    If you were discharged in a sling, please do not wear this during the day more than 48 hours after your surgery unless otherwise instructed. This may increase the risk of stiffness and soreness in your shoulder.   Avoid lotions, ointments, or perfumes over your incision until it is well-healed.  You may use a hot tub or a pool AFTER your wound check appointment if the incision is  completely closed.  Pacemaker Alerts:  Some alerts are vibratory and others beep. These are NOT emergencies. Please call our office to let us know. If this occurs at night or on weekends, it can wait until the next business day. Send a remote transmission.  If your device is capable of reading fluid status (for heart failure), you will be offered monthly monitoring to review this with you.   DEVICE MANAGEMENT Remote monitoring is used to monitor your pacemaker from home. This monitoring is scheduled every 91 days by our office. It allows [place] to keep an eye on the functioning of your device to ensure it is working properly. You will routinely see your Electrophysiologist annually (more often if necessary).   You should receive your ID card for your new device in 4-8 weeks. Keep this card with you at all times once received. Consider wearing a medical alert bracelet or necklace.  Your Pacemaker may be MRI compatible. This will be discussed at your next office visit/wound check.  You should avoid contact with strong electric or magnetic fields.   Do not use amateur (ham) radio equipment or electric (arc) welding torches. MP3 player headphones with magnets should not be used. Some devices are safe to use if held at least 12 inches (30 cm) from your Pacemaker. These include power tools, lawn mowers, and speakers. If you are unsure if something is safe to use, ask your health care provider.  When using  your cell phone, hold it to the ear that is on the opposite side from the Pacemaker. Do not leave your cell phone in a pocket over the Pacemaker.  You may safely use electric blankets, heating pads, computers, and microwave ovens.  Call the office right away if: You have chest pain. You feel more short of breath than you have felt before. You feel more light-headed than you have felt before. Your incision starts to open up.  This information is not intended to replace advice given to you by your  health care provider. Make sure you discuss any questions you have with your health care provider.

## 2022-09-06 NOTE — Interval H&P Note (Signed)
History and Physical Interval Note:  09/06/2022 7:11 AM  Andrew Mcgrath  has presented today for surgery, with the diagnosis of heart block.  The various methods of treatment have been discussed with the patient and family. After consideration of risks, benefits and other options for treatment, the patient has consented to  Procedure(s): PACEMAKER IMPLANT - DUAL CHAMBER (N/A) as a surgical intervention.  The patient's history has been reviewed, patient examined, no change in status, stable for surgery.  I have reviewed the patient's chart and labs.  Questions were answered to the patient's satisfaction.     Lewayne Bunting

## 2022-09-06 NOTE — Progress Notes (Signed)
Dr. Ladona Ridgel called with xray report. No new orders received.

## 2022-09-07 ENCOUNTER — Encounter (HOSPITAL_COMMUNITY): Payer: Self-pay | Admitting: Internal Medicine

## 2022-09-07 MED FILL — Lidocaine HCl Local Preservative Free (PF) Inj 1%: INTRAMUSCULAR | Qty: 90 | Status: AC

## 2022-09-08 DIAGNOSIS — H0102A Squamous blepharitis right eye, upper and lower eyelids: Secondary | ICD-10-CM | POA: Diagnosis not present

## 2022-09-08 DIAGNOSIS — Z961 Presence of intraocular lens: Secondary | ICD-10-CM | POA: Diagnosis not present

## 2022-09-08 DIAGNOSIS — H04123 Dry eye syndrome of bilateral lacrimal glands: Secondary | ICD-10-CM | POA: Diagnosis not present

## 2022-09-08 DIAGNOSIS — H0102B Squamous blepharitis left eye, upper and lower eyelids: Secondary | ICD-10-CM | POA: Diagnosis not present

## 2022-09-08 DIAGNOSIS — H1045 Other chronic allergic conjunctivitis: Secondary | ICD-10-CM | POA: Diagnosis not present

## 2022-09-08 DIAGNOSIS — H26493 Other secondary cataract, bilateral: Secondary | ICD-10-CM | POA: Diagnosis not present

## 2022-09-16 DIAGNOSIS — Z0100 Encounter for examination of eyes and vision without abnormal findings: Secondary | ICD-10-CM | POA: Diagnosis not present

## 2022-09-22 ENCOUNTER — Telehealth: Payer: Self-pay

## 2022-09-22 ENCOUNTER — Ambulatory Visit: Payer: Medicare HMO

## 2022-09-22 DIAGNOSIS — I459 Conduction disorder, unspecified: Secondary | ICD-10-CM | POA: Diagnosis not present

## 2022-09-22 LAB — CUP PACEART INCLINIC DEVICE CHECK
Date Time Interrogation Session: 20240828200854
Implantable Lead Connection Status: 753985
Implantable Lead Connection Status: 753985
Implantable Lead Implant Date: 20240812
Implantable Lead Implant Date: 20240812
Implantable Lead Location: 753859
Implantable Lead Location: 753860
Implantable Lead Model: 3830
Implantable Lead Model: 5076
Implantable Pulse Generator Implant Date: 20240812

## 2022-09-22 NOTE — Telephone Encounter (Signed)
Pt inquiring about how long for dental implant after PPM implant. Informed pt that it should be at least 6 weeks after PPM implant and to call back when its scheduled so we can check with GT if pt needs an abx prior to dental implant.

## 2022-09-22 NOTE — Progress Notes (Signed)
Wound check appointment. Steri-strips removed. Wound without redness or edema. Incision edges approximated, wound well healed. Normal device function. Thresholds, sensing, and impedances consistent with implant measurements. Device programmed at 3.5V/auto capture programmed on for extra safety margin until 3 month visit. Histogram distribution appropriate for patient and level of activity. 1 Fast A/V episode 8 seconds in duration, appears SVT atrial driven. Patient educated about wound care, arm mobility, lifting restrictions. ROV in 3 months with implanting physician.

## 2022-09-22 NOTE — Telephone Encounter (Signed)
Pt wife has questions about an upcoming surgery.

## 2022-09-22 NOTE — Patient Instructions (Signed)
   After Your Pacemaker   Monitor your pacemaker site for redness, swelling, and drainage. Call the device clinic at [phone] if you experience these symptoms or fever/chills.  You have some mild swelling at your device site.  This can be expected at this point in the healing process. Monitor for any increase in swelling such as area appearing to form a baseball under the skin.  If any concerning discoloration or new swelling develops, call our office immediately.  Okay to use a cold compress to the area, do not apply ice directly to the skin.    Your incision was closed with Steri-strips or staples:  You may shower 7 days after your procedure and wash your incision with soap and water. Avoid lotions, ointments, or perfumes over your incision until it is well-healed.  You may use a hot tub or a pool after your wound check appointment if the incision is completely closed.  Do not lift, push or pull greater than 10 pounds with the affected arm until 6 weeks after your procedure. UNTIL AFTER SEPTEMBER 23RD. There are no other restrictions in arm movement after your wound check appointment.  You may drive, unless driving has been restricted by your healthcare providers.  Remote monitoring is used to monitor your pacemaker from home. This monitoring is scheduled every 91 days by our office. It allows [place] to keep an eye on the functioning of your device to ensure it is working properly. You will routinely see your Electrophysiologist annually (more often if necessary).

## 2022-09-23 ENCOUNTER — Ambulatory Visit: Payer: Medicare HMO | Admitting: Internal Medicine

## 2022-09-24 ENCOUNTER — Telehealth: Payer: Self-pay

## 2022-09-24 NOTE — Telephone Encounter (Signed)
Pt wife called in stating pts site has swollen up like a golf ball. Pt wife is sending a picture to email and would like a call back once it is received.

## 2022-09-24 NOTE — Telephone Encounter (Signed)
Returned call to Pt's wife.  Pix received and reviewed.  Device site WNL.  No new bruising, no redness.  Site well healed.  Advised wife that wound site looks good.  Advised to continue to monitor and call back with any further concerns.  Education complete.

## 2022-09-29 ENCOUNTER — Other Ambulatory Visit: Payer: Self-pay | Admitting: Cardiology

## 2022-10-05 ENCOUNTER — Telehealth: Payer: Self-pay | Admitting: Internal Medicine

## 2022-10-05 ENCOUNTER — Ambulatory Visit: Payer: Medicare HMO | Attending: Internal Medicine

## 2022-10-05 DIAGNOSIS — I447 Left bundle-branch block, unspecified: Secondary | ICD-10-CM

## 2022-10-05 NOTE — Telephone Encounter (Signed)
Patient seen in device clinic. Please see device note.

## 2022-10-05 NOTE — Progress Notes (Signed)
Patient seen in device clinic/[place] office. Patient reports of pain under ICD site that woke him up x2 last night. Reports pain was no longer than 1 minute in duration. Incision assessed and incision appears well healed with no swelling, redness or drainage present. Remote transmission checks and lead data available appears WNL. Reviewed with Dr. Ladona Ridgel in office. Patient made aware per Dr. Ladona Ridgel that his site will take some time to heal, even months. Patient voiced understanding and advised to call if any further questions or concerns arise.

## 2022-10-05 NOTE — Telephone Encounter (Signed)
Transmission received and WNL. No episodes  noted.

## 2022-10-05 NOTE — Telephone Encounter (Signed)
Returned call to Pt's wife.  Appt made today in [place] to assess Pt's pacemaker site.  Pt will send a transmission prior to appt.  Will review transmission prior to appt.

## 2022-10-05 NOTE — Telephone Encounter (Signed)
Pt c/o of Chest Pain: STAT if CP now or developed within 24 hours  1. Are you having CP right now?  No   2. Are you experiencing any other symptoms (ex. SOB, nausea, vomiting, sweating)?  No   3. How long have you been experiencing CP?  Patient's wife states last night patient had 2 episodes of pain at site of PPM that woke him up from him sleep. She states both episodes lasted about 2 minutes then gradually faded. The area is sore to touch. Patient's wife states pain is only surrounding PPM, not any lower.  4. Is your CP continuous or coming and going?    5. Have you taken Nitroglycerin?  No  ?

## 2022-10-19 DIAGNOSIS — E559 Vitamin D deficiency, unspecified: Secondary | ICD-10-CM | POA: Diagnosis not present

## 2022-10-19 DIAGNOSIS — R7303 Prediabetes: Secondary | ICD-10-CM | POA: Diagnosis not present

## 2022-10-19 DIAGNOSIS — E782 Mixed hyperlipidemia: Secondary | ICD-10-CM | POA: Diagnosis not present

## 2022-10-19 DIAGNOSIS — E291 Testicular hypofunction: Secondary | ICD-10-CM | POA: Diagnosis not present

## 2022-10-25 ENCOUNTER — Telehealth: Payer: Self-pay | Admitting: Internal Medicine

## 2022-10-25 NOTE — Telephone Encounter (Signed)
Patient Name: Andrew Mcgrath  DOB: [birth year] MRN: [id number]  Primary Cardiologist: Dina Rich, MD  Chart reviewed as part of pre-operative protocol coverage.   Dental extractions (i.e. 1-2 teeth) are considered low risk procedures per guidelines and generally do not require any specific cardiac clearance. It is also generally accepted that for simple extractions and dental cleanings, there is no need to interrupt blood thinner therapy.  SBE prophylax is not required for the patient from a cardiac standpoint.  I will route this recommendation to the requesting party via Epic fax function and remove from pre-op pool.  Please call with questions.  Napoleon Form, Leodis Rains, NP 10/25/2022, 3:46 PM

## 2022-10-25 NOTE — Telephone Encounter (Signed)
Pre-operative Risk Assessment    Patient Name: Andrew Mcgrath  DOB: 26-Aug-1940 MRN: [id number]   Last visit: 08/26/2022 Next visit: 10/27/2022   Request for Surgical Clearance    Procedure:   dental implant  no extractions listed  Date of Surgery:  Clearance TBD                                 Surgeon:  Nelda Marseille, DMD Surgeon's Group or Practice Name:  [facility]   Phone number:  [phone] Fax number:  [phone]   Type of Clearance Requested:   - Medical    Type of Anesthesia:  Local    Additional requests/questions:   Clearance states that the patient is questioning if there are "any contraindications" for this dental implant procedure.  Signed, Royann Shivers   10/25/2022, 3:24 PM

## 2022-10-27 ENCOUNTER — Encounter: Payer: Self-pay | Admitting: Cardiology

## 2022-10-27 ENCOUNTER — Ambulatory Visit: Payer: Medicare HMO | Attending: Cardiology | Admitting: Cardiology

## 2022-10-27 VITALS — BP 124/74 | HR 60 | Ht 73.0 in | Wt 208.0 lb

## 2022-10-27 DIAGNOSIS — I10 Essential (primary) hypertension: Secondary | ICD-10-CM | POA: Diagnosis not present

## 2022-10-27 DIAGNOSIS — I459 Conduction disorder, unspecified: Secondary | ICD-10-CM

## 2022-10-27 DIAGNOSIS — I5020 Unspecified systolic (congestive) heart failure: Secondary | ICD-10-CM

## 2022-10-27 DIAGNOSIS — E782 Mixed hyperlipidemia: Secondary | ICD-10-CM

## 2022-10-27 DIAGNOSIS — I5032 Chronic diastolic (congestive) heart failure: Secondary | ICD-10-CM | POA: Diagnosis not present

## 2022-10-27 MED ORDER — ATORVASTATIN CALCIUM 40 MG PO TABS: 40.0000 mg | ORAL_TABLET | Freq: Every day | ORAL | 3 refills | Status: DC

## 2022-10-27 MED ORDER — LOSARTAN POTASSIUM 50 MG PO TABS: 50.0000 mg | ORAL_TABLET | Freq: Every day | ORAL | 3 refills | Status: DC

## 2022-10-27 NOTE — Progress Notes (Signed)
Clinical Summary [name] is a 82 y.o.male seen today for follow up of the following medical problems.    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 Scar vs artifact in the inferior/inferoseptal walls.  - from prior clinic note prefers to avoid invasive procedures if possible 05/2021 echo: LVEF 50-55%, grade I dd 06/2022 echo: LVEF 50-55%, grade I dd   - no recent SOB/DOE, no recent edema - remains active, walking up to 1 mile 5 days a week - compliant with meds. Andrew Mcgrath will cost $200 a month, higher once he reaches [place]   2.Chronic LBBB     3.HTN -he is compliant with meds     4. Hyperlipidemia Jan 2023 TC 142 TG 126 HDL 43 LDL 77 - Jan 2024 TC 141 TG 78 HDL 53 LDL 73 - 09/2022 TC 161 TG 096 HDL 41 LDL 72 -compliant with meds. 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 A subsequent monitor showed episodes of complete heart block - s/p pacemaker 09/06/22   Past Medical History:  Diagnosis Date   Bradycardia    Familial restless leg syndrome    GERD (gastroesophageal reflux disease)    Hyperlipidemia      Allergies  Allergen Reactions   Bee Venom Swelling   Penicillins Other (See Comments)    Pt and spouse state pt is possibly allergic to penicillin both unsure of reaction      Current Outpatient Medications  Medication Sig Dispense Refill   acetaminophen (TYLENOL) 500 MG tablet Take 500-1,000 mg by mouth every 6 (six) hours as needed for headache (Neuropathy).     aspirin EC 81 MG tablet Take 1 tablet (81 mg total) by mouth daily with breakfast. 30 tablet 11   atorvastatin (LIPITOR) 40 MG tablet Take 40 mg by mouth daily.     cetirizine (ZYRTEC) 10 MG tablet Take 10 mg by mouth daily as needed for allergies.     Cholecalciferol (D3) 25 MCG (1000 UT) capsule Take 1,000 Units by mouth daily.     diclofenac Sodium (VOLTAREN ARTHRITIS PAIN) 1 % GEL Apply 2 g  topically daily as needed (pain).     EPINEPHrine 0.3 mg/0.3 mL IJ SOAJ injection Inject 0.3 mg into the muscle as needed for anaphylaxis (Bee stings).     fluticasone (FLONASE) 50 MCG/ACT nasal spray Place 1 spray into both nostrils daily as needed for allergies or rhinitis.     metoprolol succinate (TOPROL-XL) 25 MG 24 hr tablet TAKE 1 TABLET BY MOUTH EVERY DAY 90 tablet 2   Multiple Vitamins-Minerals (MULTIVITAMIN WITH MINERALS) tablet Take 1 tablet by mouth daily.     pantoprazole sodium (PROTONIX) 40 mg Take 40 mg by mouth daily.     sacubitril-valsartan (ENTRESTO) 24-26 MG Take 1 tablet by mouth 2 (two) times daily. 60 tablet 6   testosterone cypionate (DEPOTESTOSTERONE CYPIONATE) 200 MG/ML injection Inject 200 mg into the muscle See admin instructions. Every three weeks     vitamin B-12 (CYANOCOBALAMIN) 100 MCG tablet Take 100 mcg by mouth daily.     No current facility-administered medications for this visit.     Past Surgical History:  Procedure Laterality Date   APPENDECTOMY     CARDIAC CATHETERIZATION     COLONOSCOPY     COLONOSCOPY N/A 08/23/2013   Procedure: COLONOSCOPY;  Surgeon: Malissa Hippo, MD;  [facility];  Service: Endoscopy;  Laterality: N/A;  1200   COLONOSCOPY N/A 11/06/2020   Procedure: COLONOSCOPY;  Surgeon: Malissa Hippo, MD;  [facility];  Service: Endoscopy;  Laterality: N/A;  10:30   PACEMAKER IMPLANT N/A 09/06/2022   Procedure: PACEMAKER IMPLANT - DUAL CHAMBER;  Surgeon: Marinus Maw, MD;  [facility];  Service: Cardiovascular;  Laterality: N/A;   POLYPECTOMY  11/06/2020   Procedure: POLYPECTOMY;  Surgeon: Malissa Hippo, MD;  [facility]: [facility];  Service: Endoscopy;;   TONSILLECTOMY       Allergies  Allergen Reactions   Bee Venom Swelling   Penicillins Other (See Comments)    Pt and spouse state pt is possibly allergic to penicillin both unsure of reaction       Family History  Problem  Relation Age of Onset   Heart failure Father    Diabetes Daughter    Diabetes Son    Colon cancer Neg Hx      Social History [name] reports that he has quit smoking. His smoking use included cigarettes. He has a 30 pack-year smoking history. He has never used smokeless tobacco. [name] reports current alcohol use.   Review of Systems CONSTITUTIONAL: No weight loss, fever, chills, weakness or fatigue.  HEENT: Eyes: No visual loss, blurred vision, double vision or yellow sclerae.No hearing loss, sneezing, congestion, runny nose or sore throat.  SKIN: No rash or itching.  CARDIOVASCULAR: per hpi RESPIRATORY: No shortness of breath, cough or sputum.  GASTROINTESTINAL: No anorexia, nausea, vomiting or diarrhea. No abdominal pain or blood.  GENITOURINARY: No burning on urination, no polyuria NEUROLOGICAL: No headache, dizziness, syncope, paralysis, ataxia, numbness or tingling in the extremities. No change in bowel or bladder control.  MUSCULOSKELETAL: No muscle, back pain, joint pain or stiffness.  LYMPHATICS: No enlarged nodes. No history of splenectomy.  PSYCHIATRIC: No history of depression or anxiety.  ENDOCRINOLOGIC: No reports of sweating, cold or heat intolerance. No polyuria or polydipsia.  Marland Kitchen   Physical Examination Today's Vitals   10/27/22 0959 10/27/22 1034  BP: (!) 140/78 124/74  Pulse: 60   SpO2: 95%   Weight: 208 lb (94.3 kg)   Height: 6\' 1"  (1.854 m)    Body mass index is 27.44 kg/m.  Gen: resting comfortably, no acute distress HEENT: no scleral icterus, pupils equal round and reactive, no palptable cervical adenopathy,  CV: RRR, no mrg, no jvd Resp: Clear to auscultation bilaterally GI: abdomen is soft, non-tender, non-distended, normal bowel sounds, no hepatosplenomegaly MSK: extremities are warm, no edema.  Skin: warm, no rash Neuro:  no focal deficits Psych: appropriate affect   Diagnostic Studies  12/2020 echo 1. Difficult apical windows  Images are foreshortened in some views  Overall LVEF is depressed with global hypokinesis, worse in the inferior,  inferoseptal walls.. Left ventricular ejection fraction, by estimation, is  40%%. The left ventricle has  moderately decreased function. The left ventricle demonstrates global  hypokinesis. Indeterminate diastolic filling due to E-A fusion.   2. Right ventricular systolic function is low normal. The right  ventricular size is normal.   3. Left atrial size was moderately dilated.   4. The mitral valve is normal in structure. No evidence of mitral valve  regurgitation.   5. The aortic valve is tricuspid. Aortic valve regurgitation is not  visualized.   6. The inferior vena cava is normal in size with greater than 50%  respiratory variability, suggesting right atrial pressure of 3  mmHg.    02/2021 nuclear stress  Lexiscan stress is electrically nondiagnostic due to LBBB   Myoview scan with a fixed defect in the inferior (base, mid, distal), inferoseptal (base, mid) wall consistent with scar and possible soft tissue attenuation (diaphragm)   No ischemia   LVEF on gating is 50% with mild global hypokinesis   Overall low risk scan     05/2021 echo 1. Left ventricular ejection fraction, by estimation, is 50 to 55%. The  left ventricle has low normal function. The left ventricle has no regional  wall motion abnormalities. Left ventricular diastolic parameters are  consistent with Grade I diastolic  dysfunction (impaired relaxation). The average left ventricular global  longitudinal strain is -18.3 %. The global longitudinal strain is normal.   2. Right ventricular systolic function is normal. The right ventricular  size is normal.   3. The mitral valve is normal in structure. Trivial mitral valve  regurgitation. No evidence of mitral stenosis.   4. The aortic valve is tricuspid. There is mild calcification of the  aortic valve. There is mild thickening of the aortic valve.  Aortic valve  regurgitation is not visualized. No aortic stenosis is present.   Comparison(s): Echocardiogram done 01/15/21 showed an EF of 40%     Assessment and Plan  1.HFimpEF - LVEF has normalized - no symptoms, euvolemic today - entresto expensive, we will transition to losartan 50mg  daily.      2.HTN - at goal based on repeat manual check - changing entresto to losartan due to cost, continue to monitor bp   3. Hyperlipidemia - at goal, will remain on atorvastatin 40mg  daily  4. Complete heart block/pacemaker - doing well s/p pacemaker placement, has EP f/u next month   F/u 6 months      Antoine Poche, M.D.

## 2022-10-27 NOTE — Patient Instructions (Signed)
Medication Instructions:  Your physician has recommended you make the following change in your medication:   -Stop Entresto -Start Losartan 50 mg tablet once daily.   *If you need a refill on your cardiac medications before your next appointment, please call your pharmacy*   Lab Work: None If you have labs (blood work) drawn today and your tests are completely normal, you will receive your results only by: MyChart Message (if you have MyChart) OR A paper copy in the mail If you have any lab test that is abnormal or we need to change your treatment, we will call you to review the results.   Testing/Procedures: None   Follow-Up: At [facility], you and your health needs are our priority.  As part of our continuing mission to provide you with exceptional heart care, we have created designated Provider Care Teams.  These Care Teams include your primary Cardiologist (physician) and Advanced Practice Providers (APPs -  Physician Assistants and Nurse Practitioners) who all work together to provide you with the care you need, when you need it.  We recommend signing up for the patient portal called "MyChart".  Sign up information is provided on this After Visit Summary.  MyChart is used to connect with patients for Virtual Visits (Telemedicine).  Patients are able to view lab/test results, encounter notes, upcoming appointments, etc.  Non-urgent messages can be sent to your provider as well.   To learn more about what you can do with MyChart, go to [email or web address].    Your next appointment:   6 month(s)  Provider:   You may see Dina Rich, MD or one of the following Advanced Practice Providers on your designated Care Team:   Randall An, PA-C  Jacolyn Reedy, [place]     Other Instructions

## 2022-10-29 ENCOUNTER — Encounter: Payer: Self-pay | Admitting: Internal Medicine

## 2022-10-29 DIAGNOSIS — K219 Gastro-esophageal reflux disease without esophagitis: Secondary | ICD-10-CM | POA: Diagnosis not present

## 2022-10-29 DIAGNOSIS — Z23 Encounter for immunization: Secondary | ICD-10-CM | POA: Diagnosis not present

## 2022-10-29 DIAGNOSIS — I130 Hypertensive heart and chronic kidney disease with heart failure and stage 1 through stage 4 chronic kidney disease, or unspecified chronic kidney disease: Secondary | ICD-10-CM | POA: Diagnosis not present

## 2022-10-29 DIAGNOSIS — N182 Chronic kidney disease, stage 2 (mild): Secondary | ICD-10-CM | POA: Diagnosis not present

## 2022-10-29 DIAGNOSIS — R253 Fasciculation: Secondary | ICD-10-CM | POA: Diagnosis not present

## 2022-10-29 DIAGNOSIS — M6281 Muscle weakness (generalized): Secondary | ICD-10-CM | POA: Diagnosis not present

## 2022-10-29 DIAGNOSIS — I5022 Chronic systolic (congestive) heart failure: Secondary | ICD-10-CM | POA: Diagnosis not present

## 2022-10-29 DIAGNOSIS — H5789 Other specified disorders of eye and adnexa: Secondary | ICD-10-CM | POA: Diagnosis not present

## 2022-10-29 DIAGNOSIS — R0902 Hypoxemia: Secondary | ICD-10-CM | POA: Diagnosis not present

## 2022-10-29 DIAGNOSIS — E559 Vitamin D deficiency, unspecified: Secondary | ICD-10-CM | POA: Diagnosis not present

## 2022-10-29 DIAGNOSIS — I10 Essential (primary) hypertension: Secondary | ICD-10-CM | POA: Diagnosis not present

## 2022-10-29 DIAGNOSIS — R7303 Prediabetes: Secondary | ICD-10-CM | POA: Diagnosis not present

## 2022-11-02 DIAGNOSIS — L570 Actinic keratosis: Secondary | ICD-10-CM | POA: Diagnosis not present

## 2022-11-02 DIAGNOSIS — L821 Other seborrheic keratosis: Secondary | ICD-10-CM | POA: Diagnosis not present

## 2022-11-02 DIAGNOSIS — C44212 Basal cell carcinoma of skin of right ear and external auricular canal: Secondary | ICD-10-CM | POA: Diagnosis not present

## 2022-11-02 DIAGNOSIS — C44622 Squamous cell carcinoma of skin of right upper limb, including shoulder: Secondary | ICD-10-CM | POA: Diagnosis not present

## 2022-11-02 DIAGNOSIS — D485 Neoplasm of uncertain behavior of skin: Secondary | ICD-10-CM | POA: Diagnosis not present

## 2022-11-23 ENCOUNTER — Telehealth: Payer: Self-pay

## 2022-11-23 DIAGNOSIS — R69 Illness, unspecified: Secondary | ICD-10-CM | POA: Diagnosis not present

## 2022-11-23 NOTE — Telephone Encounter (Signed)
The pt wife states he went for a 4 mile walk and got dizzy. His wife had to go get him. I asked her to send a transmission with his monitor for the nurse to review. Once we receive the transmission then the nurse will review it and give her a call back.

## 2022-11-23 NOTE — Telephone Encounter (Signed)
Transmission received 11/23/2022

## 2022-11-23 NOTE — Telephone Encounter (Signed)
Patient became weak and dizzy 2.5 miles in to his routine 4 mile walk.  Device function is normal, no episodes or concerns.  He states this is a normal walk for him that usually doesn't cause sxs.   States he tries to keep himself well hydrated, especially before and after his walk.  bp after returning home was 112/64.    Informed patient there could be a number of underlying reasons for his episode but nothing that can be seen or determined by his device.  Patient encouraged to push fluids and call and follow up with his PCP to discuss further.   Patient verbalizes understanding and will call PCP.   FYI to Dr. Ladona Ridgel.

## 2022-12-07 DIAGNOSIS — L570 Actinic keratosis: Secondary | ICD-10-CM | POA: Diagnosis not present

## 2022-12-07 DIAGNOSIS — C44212 Basal cell carcinoma of skin of right ear and external auricular canal: Secondary | ICD-10-CM | POA: Diagnosis not present

## 2022-12-16 ENCOUNTER — Ambulatory Visit: Payer: Medicare HMO | Attending: Internal Medicine | Admitting: Internal Medicine

## 2022-12-16 ENCOUNTER — Encounter: Payer: Self-pay | Admitting: Internal Medicine

## 2022-12-16 VITALS — BP 114/82 | HR 68 | Ht 73.0 in | Wt 206.0 lb

## 2022-12-16 DIAGNOSIS — I5043 Acute on chronic combined systolic (congestive) and diastolic (congestive) heart failure: Secondary | ICD-10-CM

## 2022-12-16 DIAGNOSIS — I459 Conduction disorder, unspecified: Secondary | ICD-10-CM

## 2022-12-16 DIAGNOSIS — Z950 Presence of cardiac pacemaker: Secondary | ICD-10-CM | POA: Diagnosis not present

## 2022-12-16 DIAGNOSIS — I442 Atrioventricular block, complete: Secondary | ICD-10-CM

## 2022-12-16 LAB — CUP PACEART INCLINIC DEVICE CHECK
Date Time Interrogation Session: 20241121144934
Implantable Lead Connection Status: 753985
Implantable Lead Connection Status: 753985
Implantable Lead Implant Date: 20240812
Implantable Lead Implant Date: 20240812
Implantable Lead Location: 753859
Implantable Lead Location: 753860
Implantable Lead Model: 3830
Implantable Lead Model: 5076
Implantable Pulse Generator Implant Date: 20240812

## 2022-12-16 NOTE — Progress Notes (Signed)
HPI [name] is referred by Dr. Wyline Mood for evaluation of heart block. 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 He wore a cardiac monitor which demonstrated CHB and he is referred for evaluation. The patient has had one episode of syncope. He was found on his cardiac monitor to have CHB during the daytime. He has done well since PPM insertion. No chest pain or sob with exertion.  Allergies  Allergen Reactions   Bee Venom Swelling   Penicillins Other (See Comments)    Pt and spouse state pt is possibly allergic to penicillin both unsure of reaction      Current Outpatient Medications  Medication Sig Dispense Refill   acetaminophen (TYLENOL) 500 MG tablet Take 500-1,000 mg by mouth every 6 (six) hours as needed for headache (Neuropathy).     aspirin EC 81 MG tablet Take 1 tablet (81 mg total) by mouth daily with breakfast. 30 tablet 11   atorvastatin (LIPITOR) 40 MG tablet Take 1 tablet (40 mg total) by mouth daily. 90 tablet 3   cetirizine (ZYRTEC) 10 MG tablet Take 10 mg by mouth daily as needed for allergies.     Cholecalciferol (D3) 25 MCG (1000 UT) capsule Take 1,000 Units by mouth daily.     EPINEPHrine 0.3 mg/0.3 mL IJ SOAJ injection Inject 0.3 mg into the muscle as needed for anaphylaxis (Bee stings).     fluticasone (FLONASE) 50 MCG/ACT nasal spray Place 1 spray into both nostrils daily as needed for allergies or rhinitis.     losartan (COZAAR) 50 MG tablet Take 1 tablet (50 mg total) by mouth daily. 90 tablet 3   metoprolol succinate (TOPROL-XL) 25 MG 24 hr tablet TAKE 1 TABLET BY MOUTH EVERY DAY 90 tablet 2   Multiple Vitamins-Minerals (MULTIVITAMIN WITH MINERALS) tablet Take 1 tablet by mouth daily.     pantoprazole (PROTONIX) 40 MG tablet Take 40 mg by mouth daily.     sildenafil (VIAGRA) 100 MG tablet SMARTSIG:1 Tablet(s) By Mouth     testosterone cypionate (DEPOTESTOSTERONE CYPIONATE) 200 MG/ML injection Inject 200 mg into the muscle See  admin instructions. Every three weeks     vitamin B-12 (CYANOCOBALAMIN) 100 MCG tablet Take 100 mcg by mouth daily.     diclofenac Sodium (VOLTAREN ARTHRITIS PAIN) 1 % GEL Apply 2 g topically daily as needed (pain). (Patient not taking: Reported on 12/16/2022)     No current facility-administered medications for this visit.     Past Medical History:  Diagnosis Date   Bradycardia    Familial restless leg syndrome    GERD (gastroesophageal reflux disease)    Hyperlipidemia     ROS:   All systems reviewed and negative except as noted in the HPI.   Past Surgical History:  Procedure Laterality Date   APPENDECTOMY     CARDIAC CATHETERIZATION     COLONOSCOPY     COLONOSCOPY N/A 08/23/2013   Procedure: COLONOSCOPY;  Surgeon: Malissa Hippo, MD;  [facility];  Service: Endoscopy;  Laterality: N/A;  1200   COLONOSCOPY N/A 11/06/2020   Procedure: COLONOSCOPY;  Surgeon: Malissa Hippo, MD;  [facility];  Service: Endoscopy;  Laterality: N/A;  10:30   PACEMAKER IMPLANT N/A 09/06/2022   Procedure: PACEMAKER IMPLANT - DUAL CHAMBER;  Surgeon: Marinus Maw, MD;  [facility];  Service: Cardiovascular;  Laterality: N/A;   POLYPECTOMY  11/06/2020   Procedure: POLYPECTOMY;  Surgeon: Malissa Hippo, MD;  [facility];  Service: Endoscopy;;   TONSILLECTOMY       Family History  Problem Relation Age of Onset   Heart failure Father    Diabetes Daughter    Diabetes Son    Colon cancer Neg Hx      Social History   Socioeconomic History   Marital status: Married    Spouse name: Not on file   Number of children: 3   Years of education: 12   Highest education level: Not on file  Occupational History   Not on file  Tobacco Use   Smoking status: Former    Current packs/day: 1.50    Average packs/day: 1.5 packs/day for 20.0 years (30.0 ttl pk-yrs)    Types: Cigarettes   Smokeless tobacco: Never  Vaping Use   Vaping status: Never  Used  Substance and Sexual Activity   Alcohol use: Yes    Comment: wine   Drug use: No   Sexual activity: Not on file  Other Topics Concern   Not on file  Social History Narrative   Right handed   Drinks caffeine    2 story home   Social Determinants of Health   Financial Resource Strain: Not on file  Food Insecurity: No Food Insecurity (07/21/2022)   Hunger Vital Sign    Worried About Running Out of Food in the Last Year: Never true    Ran Out of Food in the Last Year: Never true  Transportation Needs: No Transportation Needs (07/21/2022)   PRAPARE - Administrator, Civil Service (Medical): No    Lack of Transportation (Non-Medical): No  Physical Activity: Not on file  Stress: Not on file  Social Connections: Not on file  Intimate Partner Violence: Not At Risk (07/21/2022)   Humiliation, Afraid, Rape, and Kick questionnaire    Fear of Current or Ex-Partner: No    Emotionally Abused: No    Physically Abused: No    Sexually Abused: No     BP 114/82   Pulse 68   Ht 6\' 1"  (1.854 m)   Wt 206 lb (93.4 kg)   SpO2 97%   BMI 27.18 kg/m   Physical Exam:  Well appearing NAD HEENT: Unremarkable Neck:  No JVD, no thyromegally Lymphatics:  No adenopathy Back:  No CVA tenderness Lungs:  Clear with no wheezes HEART:  Regular rate rhythm, no murmurs, no rubs, no clicks Abd:  soft, positive bowel sounds, no organomegally, no rebound, no guarding Ext:  2 plus pulses, no edema, no cyanosis, no clubbing Skin:  No rashes no nodules Neuro:  CN II through XII intact, motor grossly intact  DEVICE  Normal device function.  See PaceArt for details.   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 He will continue his current meds.His meds are being adjusted by Dr. Wyline Mood. He is still walking 4 miles a day without limit.  Sharlot Gowda Mearl [place]are[place],MD

## 2022-12-16 NOTE — Patient Instructions (Signed)

## 2022-12-20 DIAGNOSIS — C44622 Squamous cell carcinoma of skin of right upper limb, including shoulder: Secondary | ICD-10-CM | POA: Diagnosis not present

## 2022-12-20 DIAGNOSIS — L905 Scar conditions and fibrosis of skin: Secondary | ICD-10-CM | POA: Diagnosis not present

## 2022-12-22 ENCOUNTER — Ambulatory Visit: Payer: Medicare HMO | Attending: Internal Medicine

## 2022-12-22 DIAGNOSIS — I459 Conduction disorder, unspecified: Secondary | ICD-10-CM

## 2022-12-24 LAB — CUP PACEART REMOTE DEVICE CHECK
Battery Remaining Longevity: 142 mo
Battery Voltage: 3.18 V
Brady Statistic AP VP Percent: 49.97 %
Brady Statistic AP VS Percent: 0.03 %
Brady Statistic AS VP Percent: 49.98 %
Brady Statistic AS VS Percent: 0.02 %
Brady Statistic RA Percent Paced: 49.97 %
Brady Statistic RV Percent Paced: 99.95 %
Date Time Interrogation Session: 20241128105026
Implantable Lead Connection Status: 753985
Implantable Lead Connection Status: 753985
Implantable Lead Implant Date: 20240812
Implantable Lead Implant Date: 20240812
Implantable Lead Location: 753859
Implantable Lead Location: 753860
Implantable Lead Model: 3830
Implantable Lead Model: 5076
Implantable Pulse Generator Implant Date: 20240812
Lead Channel Impedance Value: 323 Ohm
Lead Channel Impedance Value: 418 Ohm
Lead Channel Impedance Value: 532 Ohm
Lead Channel Impedance Value: 627 Ohm
Lead Channel Pacing Threshold Amplitude: 0.5 V
Lead Channel Pacing Threshold Amplitude: 0.875 V
Lead Channel Pacing Threshold Pulse Width: 0.4 ms
Lead Channel Pacing Threshold Pulse Width: 0.4 ms
Lead Channel Sensing Intrinsic Amplitude: 27.375 mV
Lead Channel Sensing Intrinsic Amplitude: 31.625 mV
Lead Channel Sensing Intrinsic Amplitude: 5.375 mV
Lead Channel Sensing Intrinsic Amplitude: 5.375 mV
Lead Channel Setting Pacing Amplitude: 1.5 V
Lead Channel Setting Pacing Amplitude: 2 V
Lead Channel Setting Pacing Pulse Width: 0.4 ms
Lead Channel Setting Sensing Sensitivity: 1.2 mV
Zone Setting Status: 755011

## 2023-01-10 DIAGNOSIS — I2510 Atherosclerotic heart disease of native coronary artery without angina pectoris: Secondary | ICD-10-CM | POA: Diagnosis not present

## 2023-01-10 DIAGNOSIS — R7303 Prediabetes: Secondary | ICD-10-CM | POA: Diagnosis not present

## 2023-01-10 DIAGNOSIS — Z8249 Family history of ischemic heart disease and other diseases of the circulatory system: Secondary | ICD-10-CM | POA: Diagnosis not present

## 2023-01-10 DIAGNOSIS — M1990 Unspecified osteoarthritis, unspecified site: Secondary | ICD-10-CM | POA: Diagnosis not present

## 2023-01-10 DIAGNOSIS — K219 Gastro-esophageal reflux disease without esophagitis: Secondary | ICD-10-CM | POA: Diagnosis not present

## 2023-01-10 DIAGNOSIS — Z87891 Personal history of nicotine dependence: Secondary | ICD-10-CM | POA: Diagnosis not present

## 2023-01-10 DIAGNOSIS — Z950 Presence of cardiac pacemaker: Secondary | ICD-10-CM | POA: Diagnosis not present

## 2023-01-10 DIAGNOSIS — E785 Hyperlipidemia, unspecified: Secondary | ICD-10-CM | POA: Diagnosis not present

## 2023-01-10 DIAGNOSIS — N1831 Chronic kidney disease, stage 3a: Secondary | ICD-10-CM | POA: Diagnosis not present

## 2023-01-10 DIAGNOSIS — I509 Heart failure, unspecified: Secondary | ICD-10-CM | POA: Diagnosis not present

## 2023-01-10 DIAGNOSIS — Z85828 Personal history of other malignant neoplasm of skin: Secondary | ICD-10-CM | POA: Diagnosis not present

## 2023-01-10 DIAGNOSIS — I130 Hypertensive heart and chronic kidney disease with heart failure and stage 1 through stage 4 chronic kidney disease, or unspecified chronic kidney disease: Secondary | ICD-10-CM | POA: Diagnosis not present

## 2023-02-24 DIAGNOSIS — Z86018 Personal history of other benign neoplasm: Secondary | ICD-10-CM | POA: Diagnosis not present

## 2023-02-24 DIAGNOSIS — L821 Other seborrheic keratosis: Secondary | ICD-10-CM | POA: Diagnosis not present

## 2023-02-24 DIAGNOSIS — D2272 Melanocytic nevi of left lower limb, including hip: Secondary | ICD-10-CM | POA: Diagnosis not present

## 2023-02-24 DIAGNOSIS — L738 Other specified follicular disorders: Secondary | ICD-10-CM | POA: Diagnosis not present

## 2023-02-24 DIAGNOSIS — L814 Other melanin hyperpigmentation: Secondary | ICD-10-CM | POA: Diagnosis not present

## 2023-02-24 DIAGNOSIS — Z85828 Personal history of other malignant neoplasm of skin: Secondary | ICD-10-CM | POA: Diagnosis not present

## 2023-02-24 DIAGNOSIS — D225 Melanocytic nevi of trunk: Secondary | ICD-10-CM | POA: Diagnosis not present

## 2023-02-24 DIAGNOSIS — D0339 Melanoma in situ of other parts of face: Secondary | ICD-10-CM | POA: Diagnosis not present

## 2023-02-24 DIAGNOSIS — L570 Actinic keratosis: Secondary | ICD-10-CM | POA: Diagnosis not present

## 2023-02-24 DIAGNOSIS — Z808 Family history of malignant neoplasm of other organs or systems: Secondary | ICD-10-CM | POA: Diagnosis not present

## 2023-02-24 DIAGNOSIS — L578 Other skin changes due to chronic exposure to nonionizing radiation: Secondary | ICD-10-CM | POA: Diagnosis not present

## 2023-02-24 DIAGNOSIS — D485 Neoplasm of uncertain behavior of skin: Secondary | ICD-10-CM | POA: Diagnosis not present

## 2023-03-09 ENCOUNTER — Telehealth (INDEPENDENT_AMBULATORY_CARE_PROVIDER_SITE_OTHER): Payer: Self-pay | Admitting: Otolaryngology

## 2023-03-09 NOTE — Telephone Encounter (Signed)
Left vm to confirm appt and address for 03/10/2023.

## 2023-03-10 ENCOUNTER — Encounter (INDEPENDENT_AMBULATORY_CARE_PROVIDER_SITE_OTHER): Payer: Self-pay

## 2023-03-10 ENCOUNTER — Ambulatory Visit (INDEPENDENT_AMBULATORY_CARE_PROVIDER_SITE_OTHER): Payer: Medicare HMO | Admitting: Otolaryngology

## 2023-03-10 VITALS — BP 120/68 | HR 70 | Ht 73.0 in | Wt 208.0 lb

## 2023-03-10 DIAGNOSIS — H903 Sensorineural hearing loss, bilateral: Secondary | ICD-10-CM

## 2023-03-10 DIAGNOSIS — H6123 Impacted cerumen, bilateral: Secondary | ICD-10-CM | POA: Diagnosis not present

## 2023-03-12 DIAGNOSIS — H6123 Impacted cerumen, bilateral: Secondary | ICD-10-CM | POA: Insufficient documentation

## 2023-03-12 DIAGNOSIS — H903 Sensorineural hearing loss, bilateral: Secondary | ICD-10-CM | POA: Insufficient documentation

## 2023-03-12 NOTE — Progress Notes (Signed)
Patient ID: Andrew Mcgrath, male   DOB: 04/11/1940, 83 y.o.   MRN: [id number]  Follow-up: Hearing loss  HPI: The patient is an 83 year old male who returns today for his follow-up evaluation.  The patient was previously seen for bilateral hearing loss and recurrent cerumen impaction.  At his last visit 1 year ago, he was noted to have bilateral high-frequency sensorineural hearing loss.  The patient returns today reporting no significant change in his hearing.  He denies any otalgia, otorrhea, or vertigo.  Exam: General: Communicates without difficulty, well nourished, no acute distress. Head: Normocephalic, no evidence injury, no tenderness, facial buttresses intact without stepoff. Face/sinus: No tenderness to palpation and percussion. Facial movement is normal and symmetric. Eyes: PERRL, EOMI. No scleral icterus, conjunctivae clear. Neuro: CN II exam reveals vision grossly intact. No nystagmus at any point of gaze.  Ears: Bilateral cerumen impaction. Nose: External evaluation reveals normal support and skin without lesions. Dorsum is intact. Anterior rhinoscopy reveals healthy pink mucosa over anterior aspect of inferior turbinates and intact septum. No purulence noted. Oral:  Oral cavity and oropharynx are intact, symmetric, without erythema or edema. Mucosa is moist without lesions. Neck: Full range of motion without pain. There is no significant lymphadenopathy. No masses palpable. Thyroid bed within normal limits to palpation. Parotid glands and submandibular glands equal bilaterally without mass. Trachea is midline. Neuro:  CN 2-12 grossly intact.   Procedure: Bilateral cerumen disimpaction Anesthesia: None Description: Under the operating microscope, the cerumen is carefully removed with a combination of cerumen currette, alligator forceps, and suction catheters.  After the cerumen is removed, the TMs are noted to be normal.  No mass, erythema, or lesions. The patient tolerated the procedure  well.   Assessment  1. Bilateral cerumen impaction. 2. The patient's ear canals, tympanic membranes and middle ear spaces are all normal.  3.  Subjectively stable bilateral high-frequency sensorineural hearing loss, consistent with presbycusis.   Plan  1. Otomicroscopy with cerumen disimpaction.  2. The physical exam findings are reviewed with the patient.  3. The patient is a candidate for hearing amplification.   The hearing aid options are discussed with the patient.  4. The patient will return for reevaluation in 1 year.

## 2023-03-21 DIAGNOSIS — H26491 Other secondary cataract, right eye: Secondary | ICD-10-CM | POA: Diagnosis not present

## 2023-03-23 ENCOUNTER — Ambulatory Visit (INDEPENDENT_AMBULATORY_CARE_PROVIDER_SITE_OTHER): Payer: Medicare HMO

## 2023-03-23 DIAGNOSIS — I447 Left bundle-branch block, unspecified: Secondary | ICD-10-CM

## 2023-03-24 LAB — CUP PACEART REMOTE DEVICE CHECK
Battery Remaining Longevity: 136 mo
Battery Voltage: 3.14 V
Brady Statistic AP VP Percent: 63.85 %
Brady Statistic AP VS Percent: 0.03 %
Brady Statistic AS VP Percent: 36.1 %
Brady Statistic AS VS Percent: 0.02 %
Brady Statistic RA Percent Paced: 63.86 %
Brady Statistic RV Percent Paced: 99.95 %
Date Time Interrogation Session: 20250226131137
Implantable Lead Connection Status: 753985
Implantable Lead Connection Status: 753985
Implantable Lead Implant Date: 20240812
Implantable Lead Implant Date: 20240812
Implantable Lead Location: 753859
Implantable Lead Location: 753860
Implantable Lead Model: 3830
Implantable Lead Model: 5076
Implantable Pulse Generator Implant Date: 20240812
Lead Channel Impedance Value: 342 Ohm
Lead Channel Impedance Value: 399 Ohm
Lead Channel Impedance Value: 399 Ohm
Lead Channel Impedance Value: 589 Ohm
Lead Channel Pacing Threshold Amplitude: 0.5 V
Lead Channel Pacing Threshold Amplitude: 0.5 V
Lead Channel Pacing Threshold Pulse Width: 0.4 ms
Lead Channel Pacing Threshold Pulse Width: 0.4 ms
Lead Channel Sensing Intrinsic Amplitude: 31.625 mV
Lead Channel Sensing Intrinsic Amplitude: 31.625 mV
Lead Channel Sensing Intrinsic Amplitude: 6.875 mV
Lead Channel Sensing Intrinsic Amplitude: 6.875 mV
Lead Channel Setting Pacing Amplitude: 1.5 V
Lead Channel Setting Pacing Amplitude: 2 V
Lead Channel Setting Pacing Pulse Width: 0.4 ms
Lead Channel Setting Sensing Sensitivity: 1.2 mV
Zone Setting Status: 755011

## 2023-03-27 ENCOUNTER — Encounter: Payer: Self-pay | Admitting: Internal Medicine

## 2023-03-28 DIAGNOSIS — L821 Other seborrheic keratosis: Secondary | ICD-10-CM | POA: Diagnosis not present

## 2023-03-28 DIAGNOSIS — D0339 Melanoma in situ of other parts of face: Secondary | ICD-10-CM | POA: Diagnosis not present

## 2023-03-28 DIAGNOSIS — D0330 Melanoma in situ of unspecified part of face: Secondary | ICD-10-CM | POA: Diagnosis not present

## 2023-03-29 DIAGNOSIS — D0330 Melanoma in situ of unspecified part of face: Secondary | ICD-10-CM | POA: Diagnosis not present

## 2023-03-29 DIAGNOSIS — L821 Other seborrheic keratosis: Secondary | ICD-10-CM | POA: Diagnosis not present

## 2023-04-25 DIAGNOSIS — R7303 Prediabetes: Secondary | ICD-10-CM | POA: Diagnosis not present

## 2023-04-25 DIAGNOSIS — E291 Testicular hypofunction: Secondary | ICD-10-CM | POA: Diagnosis not present

## 2023-04-25 DIAGNOSIS — E782 Mixed hyperlipidemia: Secondary | ICD-10-CM | POA: Diagnosis not present

## 2023-04-25 DIAGNOSIS — E559 Vitamin D deficiency, unspecified: Secondary | ICD-10-CM | POA: Diagnosis not present

## 2023-04-26 NOTE — Addendum Note (Signed)
 Addended by: Elease Etienne A on: 04/26/2023 12:19 PM   Modules accepted: Orders

## 2023-04-26 NOTE — Progress Notes (Signed)
 Remote pacemaker transmission.

## 2023-04-29 DIAGNOSIS — E559 Vitamin D deficiency, unspecified: Secondary | ICD-10-CM | POA: Diagnosis not present

## 2023-04-29 DIAGNOSIS — N529 Male erectile dysfunction, unspecified: Secondary | ICD-10-CM | POA: Diagnosis not present

## 2023-04-29 DIAGNOSIS — I130 Hypertensive heart and chronic kidney disease with heart failure and stage 1 through stage 4 chronic kidney disease, or unspecified chronic kidney disease: Secondary | ICD-10-CM | POA: Diagnosis not present

## 2023-04-29 DIAGNOSIS — E782 Mixed hyperlipidemia: Secondary | ICD-10-CM | POA: Diagnosis not present

## 2023-04-29 DIAGNOSIS — M6281 Muscle weakness (generalized): Secondary | ICD-10-CM | POA: Diagnosis not present

## 2023-04-29 DIAGNOSIS — H04123 Dry eye syndrome of bilateral lacrimal glands: Secondary | ICD-10-CM | POA: Diagnosis not present

## 2023-04-29 DIAGNOSIS — K219 Gastro-esophageal reflux disease without esophagitis: Secondary | ICD-10-CM | POA: Diagnosis not present

## 2023-04-29 DIAGNOSIS — I10 Essential (primary) hypertension: Secondary | ICD-10-CM | POA: Diagnosis not present

## 2023-04-29 DIAGNOSIS — N182 Chronic kidney disease, stage 2 (mild): Secondary | ICD-10-CM | POA: Diagnosis not present

## 2023-04-29 DIAGNOSIS — R7303 Prediabetes: Secondary | ICD-10-CM | POA: Diagnosis not present

## 2023-04-29 DIAGNOSIS — I5022 Chronic systolic (congestive) heart failure: Secondary | ICD-10-CM | POA: Diagnosis not present

## 2023-04-29 DIAGNOSIS — R253 Fasciculation: Secondary | ICD-10-CM | POA: Diagnosis not present

## 2023-06-14 DIAGNOSIS — M722 Plantar fascial fibromatosis: Secondary | ICD-10-CM | POA: Diagnosis not present

## 2023-06-14 DIAGNOSIS — M25572 Pain in left ankle and joints of left foot: Secondary | ICD-10-CM | POA: Diagnosis not present

## 2023-06-14 DIAGNOSIS — G8929 Other chronic pain: Secondary | ICD-10-CM | POA: Diagnosis not present

## 2023-06-22 ENCOUNTER — Ambulatory Visit (INDEPENDENT_AMBULATORY_CARE_PROVIDER_SITE_OTHER): Payer: Medicare HMO

## 2023-06-22 DIAGNOSIS — I447 Left bundle-branch block, unspecified: Secondary | ICD-10-CM

## 2023-06-23 LAB — CUP PACEART REMOTE DEVICE CHECK
Battery Remaining Longevity: 133 mo
Battery Voltage: 3.07 V
Brady Statistic AP VP Percent: 70.26 %
Brady Statistic AP VS Percent: 0.02 %
Brady Statistic AS VP Percent: 29.71 %
Brady Statistic AS VS Percent: 0.01 %
Brady Statistic RA Percent Paced: 70.26 %
Brady Statistic RV Percent Paced: 99.97 %
Date Time Interrogation Session: 20250528075818
Implantable Lead Connection Status: 753985
Implantable Lead Connection Status: 753985
Implantable Lead Implant Date: 20240812
Implantable Lead Implant Date: 20240812
Implantable Lead Location: 753859
Implantable Lead Location: 753860
Implantable Lead Model: 3830
Implantable Lead Model: 5076
Implantable Pulse Generator Implant Date: 20240812
Lead Channel Impedance Value: 323 Ohm
Lead Channel Impedance Value: 380 Ohm
Lead Channel Impedance Value: 418 Ohm
Lead Channel Impedance Value: 570 Ohm
Lead Channel Pacing Threshold Amplitude: 0.375 V
Lead Channel Pacing Threshold Amplitude: 0.625 V
Lead Channel Pacing Threshold Pulse Width: 0.4 ms
Lead Channel Pacing Threshold Pulse Width: 0.4 ms
Lead Channel Sensing Intrinsic Amplitude: 31.625 mV
Lead Channel Sensing Intrinsic Amplitude: 31.625 mV
Lead Channel Sensing Intrinsic Amplitude: 7 mV
Lead Channel Sensing Intrinsic Amplitude: 7 mV
Lead Channel Setting Pacing Amplitude: 1.5 V
Lead Channel Setting Pacing Amplitude: 2 V
Lead Channel Setting Pacing Pulse Width: 0.4 ms
Lead Channel Setting Sensing Sensitivity: 1.2 mV
Zone Setting Status: 755011

## 2023-06-24 ENCOUNTER — Ambulatory Visit: Payer: Self-pay | Admitting: Internal Medicine

## 2023-06-26 ENCOUNTER — Other Ambulatory Visit: Payer: Self-pay | Admitting: Cardiology

## 2023-07-08 DIAGNOSIS — H1045 Other chronic allergic conjunctivitis: Secondary | ICD-10-CM | POA: Diagnosis not present

## 2023-07-08 DIAGNOSIS — H04123 Dry eye syndrome of bilateral lacrimal glands: Secondary | ICD-10-CM | POA: Diagnosis not present

## 2023-07-08 DIAGNOSIS — H0015 Chalazion left lower eyelid: Secondary | ICD-10-CM | POA: Diagnosis not present

## 2023-07-12 DIAGNOSIS — D485 Neoplasm of uncertain behavior of skin: Secondary | ICD-10-CM | POA: Diagnosis not present

## 2023-07-12 DIAGNOSIS — D0462 Carcinoma in situ of skin of left upper limb, including shoulder: Secondary | ICD-10-CM | POA: Diagnosis not present

## 2023-07-12 DIAGNOSIS — L570 Actinic keratosis: Secondary | ICD-10-CM | POA: Diagnosis not present

## 2023-07-12 DIAGNOSIS — L84 Corns and callosities: Secondary | ICD-10-CM | POA: Diagnosis not present

## 2023-07-25 DIAGNOSIS — M722 Plantar fascial fibromatosis: Secondary | ICD-10-CM | POA: Diagnosis not present

## 2023-07-25 DIAGNOSIS — G8929 Other chronic pain: Secondary | ICD-10-CM | POA: Diagnosis not present

## 2023-08-09 DIAGNOSIS — C44629 Squamous cell carcinoma of skin of left upper limb, including shoulder: Secondary | ICD-10-CM | POA: Diagnosis not present

## 2023-08-15 NOTE — Addendum Note (Signed)
 Addended by: VICCI SELLER A on: 08/15/2023 11:00 AM   Modules accepted: Orders

## 2023-08-15 NOTE — Progress Notes (Signed)
 Remote pacemaker transmission.

## 2023-08-25 ENCOUNTER — Ambulatory Visit (INDEPENDENT_AMBULATORY_CARE_PROVIDER_SITE_OTHER): Admitting: Otolaryngology

## 2023-08-25 ENCOUNTER — Encounter (INDEPENDENT_AMBULATORY_CARE_PROVIDER_SITE_OTHER): Payer: Self-pay | Admitting: Otolaryngology

## 2023-08-25 VITALS — BP 119/66 | HR 60

## 2023-08-25 DIAGNOSIS — H903 Sensorineural hearing loss, bilateral: Secondary | ICD-10-CM

## 2023-08-25 DIAGNOSIS — H6123 Impacted cerumen, bilateral: Secondary | ICD-10-CM | POA: Diagnosis not present

## 2023-08-25 NOTE — Progress Notes (Signed)
 Patient ID: Andrew Mcgrath, male   DOB: 09/21/1940, 83 y.o.   MRN: [id number]  Follow up: Hearing loss  HPI:  The patient is an 83 year old male who returns today for his follow-up evaluation.  The patient was previously seen for bilateral sensorineural hearing loss.  At his previous visit in 2023, he was noted to have bilateral high-frequency sensorineural hearing loss.  The patient returns today reporting no significant change in his hearing.  He has never worn hearing aid.  He denies any otalgia, otorrhea, or vertigo.  Exam: General: Communicates without difficulty, well nourished, no acute distress. Head: Normocephalic, no evidence injury, no tenderness, facial buttresses intact without stepoff. Face/sinus: No tenderness to palpation and percussion. Facial movement is normal and symmetric. Eyes: PERRL, EOMI. No scleral icterus, conjunctivae clear. Neuro: CN II exam reveals vision grossly intact. No nystagmus at any point of gaze.  Ears: Bilateral cerumen impaction. Nose: External evaluation reveals normal support and skin without lesions. Dorsum is intact. Anterior rhinoscopy reveals healthy pink mucosa over anterior aspect of inferior turbinates and intact septum. No purulence noted. Oral:  Oral cavity and oropharynx are intact, symmetric, without erythema or edema. Mucosa is moist without lesions. Neck: Full range of motion without pain. There is no significant lymphadenopathy. No masses palpable. Thyroid  bed within normal limits to palpation. Parotid glands and submandibular glands equal bilaterally without mass. Trachea is midline. Neuro:  CN 2-12 grossly intact.   Procedure: Bilateral cerumen disimpaction Anesthesia: None Description: Under the operating microscope, the cerumen is carefully removed with a combination of cerumen currette, alligator forceps, and suction catheters.  After the cerumen is removed, the TMs are noted to be normal.  No mass, erythema, or lesions. The patient tolerated  the procedure well.   Assessment: 1. Bilateral cerumen impaction. 2. The patient's ear canals, tympanic membranes and middle ear spaces are all normal.  3. Subjectively stable bilateral high-frequency sensorineural hearing loss, consistent with presbycusis.   Plan: 1. Otomicroscopy with cerumen disimpaction.  2. The physical exam findings are reviewed with the patient. 3. The patient is a candidate for hearing amplification.   The hearing aid options are discussed with the patient.  4. The patient will return for reevaluation in 1 year.

## 2023-08-29 DIAGNOSIS — Z713 Dietary counseling and surveillance: Secondary | ICD-10-CM | POA: Diagnosis not present

## 2023-08-29 DIAGNOSIS — H903 Sensorineural hearing loss, bilateral: Secondary | ICD-10-CM | POA: Diagnosis not present

## 2023-08-29 DIAGNOSIS — I10 Essential (primary) hypertension: Secondary | ICD-10-CM | POA: Diagnosis not present

## 2023-08-29 DIAGNOSIS — R1031 Right lower quadrant pain: Secondary | ICD-10-CM | POA: Diagnosis not present

## 2023-08-29 DIAGNOSIS — H00015 Hordeolum externum left lower eyelid: Secondary | ICD-10-CM | POA: Diagnosis not present

## 2023-08-29 DIAGNOSIS — Z6828 Body mass index (BMI) 28.0-28.9, adult: Secondary | ICD-10-CM | POA: Diagnosis not present

## 2023-08-29 DIAGNOSIS — Z87891 Personal history of nicotine dependence: Secondary | ICD-10-CM | POA: Diagnosis not present

## 2023-08-29 DIAGNOSIS — Z79899 Other long term (current) drug therapy: Secondary | ICD-10-CM | POA: Diagnosis not present

## 2023-08-29 DIAGNOSIS — M722 Plantar fascial fibromatosis: Secondary | ICD-10-CM | POA: Diagnosis not present

## 2023-08-29 DIAGNOSIS — Z7182 Exercise counseling: Secondary | ICD-10-CM | POA: Diagnosis not present

## 2023-09-01 DIAGNOSIS — L578 Other skin changes due to chronic exposure to nonionizing radiation: Secondary | ICD-10-CM | POA: Diagnosis not present

## 2023-09-01 DIAGNOSIS — L570 Actinic keratosis: Secondary | ICD-10-CM | POA: Diagnosis not present

## 2023-09-01 DIAGNOSIS — D225 Melanocytic nevi of trunk: Secondary | ICD-10-CM | POA: Diagnosis not present

## 2023-09-01 DIAGNOSIS — Z85828 Personal history of other malignant neoplasm of skin: Secondary | ICD-10-CM | POA: Diagnosis not present

## 2023-09-01 DIAGNOSIS — D2272 Melanocytic nevi of left lower limb, including hip: Secondary | ICD-10-CM | POA: Diagnosis not present

## 2023-09-01 DIAGNOSIS — Z86018 Personal history of other benign neoplasm: Secondary | ICD-10-CM | POA: Diagnosis not present

## 2023-09-01 DIAGNOSIS — Z808 Family history of malignant neoplasm of other organs or systems: Secondary | ICD-10-CM | POA: Diagnosis not present

## 2023-09-01 DIAGNOSIS — L821 Other seborrheic keratosis: Secondary | ICD-10-CM | POA: Diagnosis not present

## 2023-09-01 DIAGNOSIS — L814 Other melanin hyperpigmentation: Secondary | ICD-10-CM | POA: Diagnosis not present

## 2023-09-21 ENCOUNTER — Ambulatory Visit (INDEPENDENT_AMBULATORY_CARE_PROVIDER_SITE_OTHER): Payer: Medicare HMO

## 2023-09-21 DIAGNOSIS — H0102A Squamous blepharitis right eye, upper and lower eyelids: Secondary | ICD-10-CM | POA: Diagnosis not present

## 2023-09-21 DIAGNOSIS — Z961 Presence of intraocular lens: Secondary | ICD-10-CM | POA: Diagnosis not present

## 2023-09-21 DIAGNOSIS — H1045 Other chronic allergic conjunctivitis: Secondary | ICD-10-CM | POA: Diagnosis not present

## 2023-09-21 DIAGNOSIS — H04123 Dry eye syndrome of bilateral lacrimal glands: Secondary | ICD-10-CM | POA: Diagnosis not present

## 2023-09-21 DIAGNOSIS — H26492 Other secondary cataract, left eye: Secondary | ICD-10-CM | POA: Diagnosis not present

## 2023-09-21 DIAGNOSIS — I447 Left bundle-branch block, unspecified: Secondary | ICD-10-CM | POA: Diagnosis not present

## 2023-09-21 DIAGNOSIS — H0102B Squamous blepharitis left eye, upper and lower eyelids: Secondary | ICD-10-CM | POA: Diagnosis not present

## 2023-09-22 LAB — CUP PACEART REMOTE DEVICE CHECK
Battery Remaining Longevity: 130 mo
Battery Voltage: 3.04 V
Brady Statistic AP VP Percent: 63.74 %
Brady Statistic AP VS Percent: 0.02 %
Brady Statistic AS VP Percent: 36.23 %
Brady Statistic AS VS Percent: 0.01 %
Brady Statistic RA Percent Paced: 63.74 %
Brady Statistic RV Percent Paced: 99.97 %
Date Time Interrogation Session: 20250826231153
Implantable Lead Connection Status: 753985
Implantable Lead Connection Status: 753985
Implantable Lead Implant Date: 20240812
Implantable Lead Implant Date: 20240812
Implantable Lead Location: 753859
Implantable Lead Location: 753860
Implantable Lead Model: 3830
Implantable Lead Model: 5076
Implantable Pulse Generator Implant Date: 20240812
Lead Channel Impedance Value: 323 Ohm
Lead Channel Impedance Value: 380 Ohm
Lead Channel Impedance Value: 418 Ohm
Lead Channel Impedance Value: 570 Ohm
Lead Channel Pacing Threshold Amplitude: 0.375 V
Lead Channel Pacing Threshold Amplitude: 0.625 V
Lead Channel Pacing Threshold Pulse Width: 0.4 ms
Lead Channel Pacing Threshold Pulse Width: 0.4 ms
Lead Channel Sensing Intrinsic Amplitude: 31.625 mV
Lead Channel Sensing Intrinsic Amplitude: 31.625 mV
Lead Channel Sensing Intrinsic Amplitude: 6.625 mV
Lead Channel Sensing Intrinsic Amplitude: 6.625 mV
Lead Channel Setting Pacing Amplitude: 1.5 V
Lead Channel Setting Pacing Amplitude: 2 V
Lead Channel Setting Pacing Pulse Width: 0.4 ms
Lead Channel Setting Sensing Sensitivity: 1.2 mV
Zone Setting Status: 755011

## 2023-09-26 ENCOUNTER — Ambulatory Visit: Payer: Self-pay | Admitting: Internal Medicine

## 2023-10-07 ENCOUNTER — Other Ambulatory Visit: Payer: Self-pay | Admitting: Cardiology

## 2023-10-10 NOTE — Progress Notes (Signed)
 Remote PPM Transmission

## 2023-10-24 DIAGNOSIS — Z09 Encounter for follow-up examination after completed treatment for conditions other than malignant neoplasm: Secondary | ICD-10-CM | POA: Diagnosis not present

## 2023-10-24 DIAGNOSIS — Z8619 Personal history of other infectious and parasitic diseases: Secondary | ICD-10-CM | POA: Diagnosis not present

## 2023-10-24 DIAGNOSIS — E559 Vitamin D deficiency, unspecified: Secondary | ICD-10-CM | POA: Diagnosis not present

## 2023-10-24 DIAGNOSIS — R202 Paresthesia of skin: Secondary | ICD-10-CM | POA: Diagnosis not present

## 2023-10-24 DIAGNOSIS — E291 Testicular hypofunction: Secondary | ICD-10-CM | POA: Diagnosis not present

## 2023-10-24 DIAGNOSIS — E782 Mixed hyperlipidemia: Secondary | ICD-10-CM | POA: Diagnosis not present

## 2023-10-31 ENCOUNTER — Encounter: Payer: Self-pay | Admitting: Internal Medicine

## 2023-10-31 DIAGNOSIS — Z0001 Encounter for general adult medical examination with abnormal findings: Secondary | ICD-10-CM | POA: Diagnosis not present

## 2023-10-31 DIAGNOSIS — I130 Hypertensive heart and chronic kidney disease with heart failure and stage 1 through stage 4 chronic kidney disease, or unspecified chronic kidney disease: Secondary | ICD-10-CM | POA: Diagnosis not present

## 2023-10-31 DIAGNOSIS — I5022 Chronic systolic (congestive) heart failure: Secondary | ICD-10-CM | POA: Diagnosis not present

## 2023-10-31 DIAGNOSIS — I10 Essential (primary) hypertension: Secondary | ICD-10-CM | POA: Diagnosis not present

## 2023-10-31 DIAGNOSIS — Z0000 Encounter for general adult medical examination without abnormal findings: Secondary | ICD-10-CM | POA: Diagnosis not present

## 2023-10-31 DIAGNOSIS — R253 Fasciculation: Secondary | ICD-10-CM | POA: Diagnosis not present

## 2023-10-31 DIAGNOSIS — N182 Chronic kidney disease, stage 2 (mild): Secondary | ICD-10-CM | POA: Diagnosis not present

## 2023-10-31 DIAGNOSIS — M6281 Muscle weakness (generalized): Secondary | ICD-10-CM | POA: Diagnosis not present

## 2023-10-31 DIAGNOSIS — Z23 Encounter for immunization: Secondary | ICD-10-CM | POA: Diagnosis not present

## 2023-10-31 DIAGNOSIS — R7303 Prediabetes: Secondary | ICD-10-CM | POA: Diagnosis not present

## 2023-10-31 DIAGNOSIS — E782 Mixed hyperlipidemia: Secondary | ICD-10-CM | POA: Diagnosis not present

## 2023-10-31 DIAGNOSIS — K219 Gastro-esophageal reflux disease without esophagitis: Secondary | ICD-10-CM | POA: Diagnosis not present

## 2023-11-02 DIAGNOSIS — H26492 Other secondary cataract, left eye: Secondary | ICD-10-CM | POA: Diagnosis not present

## 2023-12-02 ENCOUNTER — Other Ambulatory Visit: Payer: Self-pay | Admitting: Internal Medicine

## 2023-12-15 DIAGNOSIS — L570 Actinic keratosis: Secondary | ICD-10-CM | POA: Diagnosis not present

## 2023-12-21 ENCOUNTER — Ambulatory Visit: Payer: Medicare HMO

## 2023-12-21 DIAGNOSIS — I447 Left bundle-branch block, unspecified: Secondary | ICD-10-CM

## 2023-12-23 LAB — CUP PACEART REMOTE DEVICE CHECK
Battery Remaining Longevity: 127 mo
Battery Voltage: 3.03 V
Brady Statistic AP VP Percent: 69.34 %
Brady Statistic AP VS Percent: 0.01 %
Brady Statistic AS VP Percent: 30.63 %
Brady Statistic AS VS Percent: 0.01 %
Brady Statistic RA Percent Paced: 69.34 %
Brady Statistic RV Percent Paced: 99.97 %
Date Time Interrogation Session: 20251125191122
Implantable Lead Connection Status: 753985
Implantable Lead Connection Status: 753985
Implantable Lead Implant Date: 20240812
Implantable Lead Implant Date: 20240812
Implantable Lead Location: 753859
Implantable Lead Location: 753860
Implantable Lead Model: 3830
Implantable Lead Model: 5076
Implantable Pulse Generator Implant Date: 20240812
Lead Channel Impedance Value: 323 Ohm
Lead Channel Impedance Value: 380 Ohm
Lead Channel Impedance Value: 399 Ohm
Lead Channel Impedance Value: 570 Ohm
Lead Channel Pacing Threshold Amplitude: 0.375 V
Lead Channel Pacing Threshold Amplitude: 0.625 V
Lead Channel Pacing Threshold Pulse Width: 0.4 ms
Lead Channel Pacing Threshold Pulse Width: 0.4 ms
Lead Channel Sensing Intrinsic Amplitude: 31.625 mV
Lead Channel Sensing Intrinsic Amplitude: 31.625 mV
Lead Channel Sensing Intrinsic Amplitude: 5.875 mV
Lead Channel Sensing Intrinsic Amplitude: 5.875 mV
Lead Channel Setting Pacing Amplitude: 1.5 V
Lead Channel Setting Pacing Amplitude: 2 V
Lead Channel Setting Pacing Pulse Width: 0.4 ms
Lead Channel Setting Sensing Sensitivity: 1.2 mV
Zone Setting Status: 755011

## 2023-12-27 NOTE — Progress Notes (Signed)
 Remote PPM Transmission

## 2023-12-29 ENCOUNTER — Ambulatory Visit: Payer: Self-pay | Admitting: Internal Medicine

## 2023-12-29 ENCOUNTER — Other Ambulatory Visit: Payer: Self-pay | Admitting: Cardiology

## 2023-12-30 ENCOUNTER — Other Ambulatory Visit: Payer: Self-pay | Admitting: Cardiology

## 2024-01-02 ENCOUNTER — Encounter (INDEPENDENT_AMBULATORY_CARE_PROVIDER_SITE_OTHER): Payer: Self-pay | Admitting: Gastroenterology

## 2024-01-02 ENCOUNTER — Ambulatory Visit (INDEPENDENT_AMBULATORY_CARE_PROVIDER_SITE_OTHER): Admitting: Gastroenterology

## 2024-01-02 VITALS — BP 130/75 | HR 62 | Temp 97.8°F | Ht 73.0 in | Wt 216.0 lb

## 2024-01-02 DIAGNOSIS — K219 Gastro-esophageal reflux disease without esophagitis: Secondary | ICD-10-CM | POA: Diagnosis not present

## 2024-01-02 NOTE — Patient Instructions (Signed)
 Continue pantoprazole  40 mg daily Take over-the-counter Pepcid   20 mg every night Explained presumed etiology of reflux symptoms. Instruction provided in the use of antireflux medication - patient should take pantoprazole  in the morning 30-45 minutes before eating breakfast. Discussed avoidance of eating within 2 hours of lying down to sleep and benefit of blocks to elevate head of bed. Also, will benefit from avoiding carbonated drinks/sodas or food that has tomatoes, spicy or greasy food. Schedule EGD

## 2024-01-02 NOTE — H&P (View-Only) (Signed)
 Toribio Fortune, M.D. Gastroenterology & Hepatology [facility] [address] [place], [place] [address] Primary Care Physician: Shona Norleen PEDLAR, MD [address] [place] [address]  Referring MD: PCP  Chief Complaint:  GERD  History of Present Illness: Andrew Mcgrath is a 83 y.o. male with past medical history of GERD, hyperlipidemia and restless leg syndrome, who presents for evaluation of GERD.  The patient has presented issues with GERD for multiple years.  Has been managing this with the use of pantoprazole  40 mg every day for a little more than 2 years. He was previously on omeprazole since his 36s, but this was switched by his PCP as his kidney function was affected.  Patient reports that for the last 2 weeks he had exacerbation of his reflux, as he has had frequent episodes of regurgitation when bending over or late in the evening. Intermittently in the past he has presented some breakthrough episodes of heartburn, which have been bothering him. No dysphagia or odynophagia. Wife reports that he has had issue with losing his voice when talking. Takes pantoprazole  15 minutes prior to his breakfast. Has not changed his diet recently. Has been taking Pepcid  since his symptoms started, but has been taking it as needed.  The patient denies having any nausea, vomiting, fever, chills, hematochezia, melena, hematemesis, abdominal distention, abdominal pain, diarrhea, jaundice, pruritus. Has gained some lb in the last year (5-10lb).  Last ZHI:wzczm Last Colonoscopy:11/06/20 - Normal terminal ileum. - One small polyp at the hepatic flexure. Biopsied. - Two 4 to 6 mm polyps in the transverse colon, removed with a cold snare. Resected and retrieved. - One 10 mm polyp at the splenic flexure, removed with a cold snare. Resected and retrieved. Clips ( MR conditional) were placed. - External and internal hemorrhoids.  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   Patient was advised to repeat colonoscopy in 5 years if medically fit.  FHx: neg for any gastrointestinal/liver disease, no malignancies Social: neg smoking, alcohol or illicit drug use Surgical: appendectomy  Past Medical History: Past Medical History:  Diagnosis Date   Bradycardia    Familial restless leg syndrome    GERD (gastroesophageal reflux disease)    Hyperlipidemia     Past Surgical History: Past Surgical History:  Procedure Laterality Date   APPENDECTOMY     CARDIAC CATHETERIZATION     COLONOSCOPY     COLONOSCOPY N/A 08/23/2013   Procedure: COLONOSCOPY;  Surgeon: Claudis RAYMOND Rivet, MD;  [facility];  Service: Endoscopy;  Laterality: N/A;  1200   COLONOSCOPY N/A 11/06/2020   Procedure: COLONOSCOPY;  Surgeon: Rivet Claudis RAYMOND, MD;  [facility];  Service: Endoscopy;  Laterality: N/A;  10:30   PACEMAKER IMPLANT N/A 09/06/2022   Procedure: PACEMAKER IMPLANT - DUAL CHAMBER;  Surgeon: Waddell Danelle ORN, MD;  [facility];  Service: Cardiovascular;  Laterality: N/A;   POLYPECTOMY  11/06/2020   Procedure: POLYPECTOMY;  Surgeon: Rivet Claudis RAYMOND, MD;  [facility];  Service: Endoscopy;;   TONSILLECTOMY      Family History: Family History  Problem Relation Age of Onset   Heart failure Father    Diabetes Daughter    Diabetes Son    Colon cancer Neg Hx     Social History: Social History   Tobacco Use  Smoking Status Former   Current packs/day: 1.50   Average packs/day: 1.5 packs/day for 20.0 years (30.0 ttl pk-yrs)   Types:  Cigarettes  Smokeless Tobacco Never   Social History   Substance and Sexual Activity  Alcohol Use Yes   Comment: wine   Social History   Substance and Sexual Activity  Drug Use No    Allergies: Allergies  Allergen Reactions   Bee Venom Swelling   Penicillins Other (See Comments)    Pt and spouse state pt is possibly allergic to penicillin both unsure of reaction      Medications: Current Outpatient Medications  Medication Sig Dispense Refill   acetaminophen  (TYLENOL ) 500 MG tablet Take 500-1,000 mg by mouth every 6 (six) hours as needed for headache (Neuropathy).     aspirin  EC 81 MG tablet Take 1 tablet (81 mg total) by mouth daily with breakfast. 30 tablet 11   atorvastatin  (LIPITOR) 40 MG tablet TAKE 1 TABLET BY MOUTH EVERY DAY 30 tablet 0   cetirizine (ZYRTEC) 10 MG tablet Take 10 mg by mouth daily as needed for allergies.     Cholecalciferol (D3) 25 MCG (1000 UT) capsule Take 1,000 Units by mouth daily.     diclofenac  Sodium (VOLTAREN  ARTHRITIS PAIN) 1 % GEL Apply 2 g topically daily as needed (pain).     EPINEPHrine  0.3 mg/0.3 mL IJ SOAJ injection Inject 0.3 mg into the muscle as needed for anaphylaxis (Bee stings).     fluticasone (FLONASE) 50 MCG/ACT nasal spray Place 1 spray into both nostrils daily as needed for allergies or rhinitis.     losartan  (COZAAR ) 50 MG tablet TAKE 1 TABLET BY MOUTH EVERY DAY 30 tablet 0   metoprolol  succinate (TOPROL -XL) 25 MG 24 hr tablet Take 1 tablet (25 mg total) by mouth daily. Please call [phone] to schedule an appointment with Dr. Alvan for future refills. Thank you. 90 tablet 0   Multiple Vitamins-Minerals (MULTIVITAMIN WITH MINERALS) tablet Take 1 tablet by mouth daily.     pantoprazole  (PROTONIX ) 40 MG tablet Take 40 mg by mouth daily.     sildenafil (VIAGRA) 100 MG tablet SMARTSIG:1 Tablet(s) By Mouth (Patient taking differently: as needed.)     testosterone  cypionate (DEPOTESTOSTERONE CYPIONATE) 200 MG/ML injection Inject 200 mg into the muscle See admin instructions. Every three weeks (Patient taking differently: Inject 100 mg into the muscle every 14 (fourteen) days.)     vitamin B-12 (CYANOCOBALAMIN) 100 MCG tablet Take 100 mcg by mouth daily.     No current facility-administered medications for this visit.    Review of Systems: GENERAL: negative for malaise, night sweats HEENT: No changes in  hearing or vision, no nose bleeds or other nasal problems. NECK: Negative for lumps, goiter, pain and significant neck swelling RESPIRATORY: Negative for cough, wheezing CARDIOVASCULAR: Negative for chest pain, leg swelling, palpitations, orthopnea GI: SEE HPI MUSCULOSKELETAL: Negative for joint pain or swelling, back pain, and muscle pain. SKIN: Negative for lesions, rash PSYCH: Negative for sleep disturbance, mood disorder and recent psychosocial stressors. HEMATOLOGY Negative for prolonged bleeding, bruising easily, and swollen nodes. ENDOCRINE: Negative for cold or heat intolerance, polyuria, polydipsia and goiter. NEURO: negative for tremor, gait imbalance, syncope and seizures. The remainder of the review of systems is noncontributory.   Physical Exam: BP 130/75 (BP Location: Left Arm, Patient Position: Sitting, Cuff Size: Normal)   Pulse 62   Temp 97.8 F (36.6 C) (Temporal)   Ht 6' 1 (1.854 m)   Wt 216 lb (98 kg)   BMI 28.50 kg/m  GENERAL: The patient is AO x3, in no acute distress. HEENT: Head is normocephalic and atraumatic. EOMI  are intact. Mouth is well hydrated and without lesions. NECK: Supple. No masses LUNGS: Clear to auscultation. No presence of rhonchi/wheezing/rales. Adequate chest expansion HEART: RRR, normal s1 and s2. ABDOMEN: Soft, nontender, no guarding, no peritoneal signs, and nondistended. BS +. No masses. EXTREMITIES: Without any cyanosis, clubbing, rash, lesions or edema. NEUROLOGIC: AOx3, no focal motor deficit. SKIN: no jaundice, no rashes   Imaging/Labs: as above  I personally reviewed and interpreted the available labs, imaging and endoscopic files.  Impression and Plan: Andrew Mcgrath is a 83 y.o. male with past medical history of GERD, hyperlipidemia and restless leg syndrome, who presents for evaluation of GERD.  Patient has recent worsening of symptoms of GERD despite compliant intake of PPI for years.  He denies any other associated  symptoms and has been able to manage some of his symptoms with the addition of as needed Pepcid .  We discussed in detail the best way to take the PPI, but I will also recommend continuing Pepcid  on a regular basis at night.  However, given his age and worsening of symptoms, he will benefit from an endoscopic evaluation to rule out any endoluminal etiologies causing his worsening symptoms.  -Continue pantoprazole  40 mg daily -Take over-the-counter Pepcid   20 mg every night -Explained presumed etiology of reflux symptoms. Instruction provided in the use of antireflux medication - patient should take pantoprazole  in the morning 30-45 minutes before eating breakfast. Discussed avoidance of eating within 2 hours of lying down to sleep and benefit of blocks to elevate head of bed. Also, will benefit from avoiding carbonated drinks/sodas or food that has tomatoes, spicy or greasy food. -Schedule EGD  All questions were answered.      Toribio Fortune, MD Gastroenterology and Hepatology [facility]

## 2024-01-02 NOTE — Progress Notes (Signed)
 Andrew Mcgrath, M.D. Gastroenterology & Hepatology [facility]enter[facility] [address] [place], [place] [address] Primary Care Physician: Shona Norleen PEDLAR, MD [address] [place] [address]  Referring MD: PCP  Chief Complaint:  GERD  History of Present Illness: Andrew Mcgrath is a 83 y.o. male with past medical history of GERD, hyperlipidemia and restless leg syndrome, who presents for evaluation of GERD.  The patient has presented issues with GERD for multiple years.  Has been managing this with the use of pantoprazole  40 mg every day for a little more than 2 years. He was previously on omeprazole since his 36s, but this was switched by his PCP as his kidney function was affected.  Patient reports that for the last 2 weeks he had exacerbation of his reflux, as he has had frequent episodes of regurgitation when bending over or late in the evening. Intermittently in the past he has presented some breakthrough episodes of heartburn, which have been bothering him. No dysphagia or odynophagia. Wife reports that he has had issue with losing his voice when talking. Takes pantoprazole  15 minutes prior to his breakfast. Has not changed his diet recently. Has been taking Pepcid  since his symptoms started, but has been taking it as needed.  The patient denies having any nausea, vomiting, fever, chills, hematochezia, melena, hematemesis, abdominal distention, abdominal pain, diarrhea, jaundice, pruritus. Has gained some lb in the last year (5-10lb).  Last ZHI:wzczm Last Colonoscopy:11/06/20 - Normal terminal ileum. - One small polyp at the hepatic flexure. Biopsied. - Two 4 to 6 mm polyps in the transverse colon, removed with a cold snare. Resected and retrieved. - One 10 mm polyp at the splenic flexure, removed with a cold snare. Resected and retrieved. Clips ( MR conditional) were placed. - External and internal hemorrhoids.  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   Patient was advised to repeat colonoscopy in 5 years if medically fit.  FHx: neg for any gastrointestinal/liver disease, no malignancies Social: neg smoking, alcohol or illicit drug use Surgical: appendectomy  Past Medical History: Past Medical History:  Diagnosis Date   Bradycardia    Familial restless leg syndrome    GERD (gastroesophageal reflux disease)    Hyperlipidemia     Past Surgical History: Past Surgical History:  Procedure Laterality Date   APPENDECTOMY     CARDIAC CATHETERIZATION     COLONOSCOPY     COLONOSCOPY N/A 08/23/2013   Procedure: COLONOSCOPY;  Surgeon: Claudis RAYMOND Rivet, MD;  [facility];  Service: Endoscopy;  Laterality: N/A;  1200   COLONOSCOPY N/A 11/06/2020   Procedure: COLONOSCOPY;  Surgeon: Rivet Claudis RAYMOND, MD;  [facility];  Service: Endoscopy;  Laterality: N/A;  10:30   PACEMAKER IMPLANT N/A 09/06/2022   Procedure: PACEMAKER IMPLANT - DUAL CHAMBER;  Surgeon: Waddell Danelle ORN, MD;  [facility];  Service: Cardiovascular;  Laterality: N/A;   POLYPECTOMY  11/06/2020   Procedure: POLYPECTOMY;  Surgeon: Rivet Claudis RAYMOND, MD;  [facility];  Service: Endoscopy;;   TONSILLECTOMY      Family History: Family History  Problem Relation Age of Onset   Heart failure Father    Diabetes Daughter    Diabetes Son    Colon cancer Neg Hx     Social History: Social History   Tobacco Use  Smoking Status Former   Current packs/day: 1.50   Average packs/day: 1.5 packs/day for 20.0 years (30.0 ttl pk-yrs)   Types:  Cigarettes  Smokeless Tobacco Never   Social History   Substance and Sexual Activity  Alcohol Use Yes   Comment: wine   Social History   Substance and Sexual Activity  Drug Use No    Allergies: Allergies  Allergen Reactions   Bee Venom Swelling   Penicillins Other (See Comments)    Pt and spouse state pt is possibly allergic to penicillin both unsure of reaction      Medications: Current Outpatient Medications  Medication Sig Dispense Refill   acetaminophen  (TYLENOL ) 500 MG tablet Take 500-1,000 mg by mouth every 6 (six) hours as needed for headache (Neuropathy).     aspirin  EC 81 MG tablet Take 1 tablet (81 mg total) by mouth daily with breakfast. 30 tablet 11   atorvastatin  (LIPITOR) 40 MG tablet TAKE 1 TABLET BY MOUTH EVERY DAY 30 tablet 0   cetirizine (ZYRTEC) 10 MG tablet Take 10 mg by mouth daily as needed for allergies.     Cholecalciferol (D3) 25 MCG (1000 UT) capsule Take 1,000 Units by mouth daily.     diclofenac  Sodium (VOLTAREN  ARTHRITIS PAIN) 1 % GEL Apply 2 g topically daily as needed (pain).     EPINEPHrine  0.3 mg/0.3 mL IJ SOAJ injection Inject 0.3 mg into the muscle as needed for anaphylaxis (Bee stings).     fluticasone (FLONASE) 50 MCG/ACT nasal spray Place 1 spray into both nostrils daily as needed for allergies or rhinitis.     losartan  (COZAAR ) 50 MG tablet TAKE 1 TABLET BY MOUTH EVERY DAY 30 tablet 0   metoprolol  succinate (TOPROL -XL) 25 MG 24 hr tablet Take 1 tablet (25 mg total) by mouth daily. Please call [phone] to schedule an appointment with Dr. Alvan for future refills. Thank you. 90 tablet 0   Multiple Vitamins-Minerals (MULTIVITAMIN WITH MINERALS) tablet Take 1 tablet by mouth daily.     pantoprazole  (PROTONIX ) 40 MG tablet Take 40 mg by mouth daily.     sildenafil (VIAGRA) 100 MG tablet SMARTSIG:1 Tablet(s) By Mouth (Patient taking differently: as needed.)     testosterone  cypionate (DEPOTESTOSTERONE CYPIONATE) 200 MG/ML injection Inject 200 mg into the muscle See admin instructions. Every three weeks (Patient taking differently: Inject 100 mg into the muscle every 14 (fourteen) days.)     vitamin B-12 (CYANOCOBALAMIN) 100 MCG tablet Take 100 mcg by mouth daily.     No current facility-administered medications for this visit.    Review of Systems: GENERAL: negative for malaise, night sweats HEENT: No changes in  hearing or vision, no nose bleeds or other nasal problems. NECK: Negative for lumps, goiter, pain and significant neck swelling RESPIRATORY: Negative for cough, wheezing CARDIOVASCULAR: Negative for chest pain, leg swelling, palpitations, orthopnea GI: SEE HPI MUSCULOSKELETAL: Negative for joint pain or swelling, back pain, and muscle pain. SKIN: Negative for lesions, rash PSYCH: Negative for sleep disturbance, mood disorder and recent psychosocial stressors. HEMATOLOGY Negative for prolonged bleeding, bruising easily, and swollen nodes. ENDOCRINE: Negative for cold or heat intolerance, polyuria, polydipsia and goiter. NEURO: negative for tremor, gait imbalance, syncope and seizures. The remainder of the review of systems is noncontributory.   Physical Exam: BP 130/75 (BP Location: Left Arm, Patient Position: Sitting, Cuff Size: Normal)   Pulse 62   Temp 97.8 F (36.6 C) (Temporal)   Ht 6' 1 (1.854 m)   Wt 216 lb (98 kg)   BMI 28.50 kg/m  GENERAL: The patient is AO x3, in no acute distress. HEENT: Head is normocephalic and atraumatic. EOMI  are intact. Mouth is well hydrated and without lesions. NECK: Supple. No masses LUNGS: Clear to auscultation. No presence of rhonchi/wheezing/rales. Adequate chest expansion HEART: RRR, normal s1 and s2. ABDOMEN: Soft, nontender, no guarding, no peritoneal signs, and nondistended. BS +. No masses. EXTREMITIES: Without any cyanosis, clubbing, rash, lesions or edema. NEUROLOGIC: AOx3, no focal motor deficit. SKIN: no jaundice, no rashes   Imaging/Labs: as above  I personally reviewed and interpreted the available labs, imaging and endoscopic files.  Impression and Plan: CRECENCIO KWIATEK is a 83 y.o. male with past medical history of GERD, hyperlipidemia and restless leg syndrome, who presents for evaluation of GERD.  Patient has recent worsening of symptoms of GERD despite compliant intake of PPI for years.  He denies any other associated  symptoms and has been able to manage some of his symptoms with the addition of as needed Pepcid .  We discussed in detail the best way to take the PPI, but I will also recommend continuing Pepcid  on a regular basis at night.  However, given his age and worsening of symptoms, he will benefit from an endoscopic evaluation to rule out any endoluminal etiologies causing his worsening symptoms.  -Continue pantoprazole  40 mg daily -Take over-the-counter Pepcid   20 mg every night -Explained presumed etiology of reflux symptoms. Instruction provided in the use of antireflux medication - patient should take pantoprazole  in the morning 30-45 minutes before eating breakfast. Discussed avoidance of eating within 2 hours of lying down to sleep and benefit of blocks to elevate head of bed. Also, will benefit from avoiding carbonated drinks/sodas or food that has tomatoes, spicy or greasy food. -Schedule EGD  All questions were answered.      Andrew Fortune, MD Gastroenterology and Hepatology [facility] Gastroenterology

## 2024-01-03 ENCOUNTER — Telehealth (INDEPENDENT_AMBULATORY_CARE_PROVIDER_SITE_OTHER): Payer: Self-pay

## 2024-01-03 NOTE — Telephone Encounter (Signed)
 ATC patient to schedule EGD, no answer. LVM for call back.

## 2024-01-04 NOTE — Telephone Encounter (Signed)
 PA is not required for this insurance and cpt

## 2024-01-04 NOTE — Telephone Encounter (Signed)
 Spoke with patient and his wife, scheduled EGD for 01/09/2024 at 11:00am. Gave instructions over the phone.

## 2024-01-05 ENCOUNTER — Encounter (HOSPITAL_COMMUNITY): Payer: Self-pay

## 2024-01-05 ENCOUNTER — Encounter (HOSPITAL_COMMUNITY)
Admission: RE | Admit: 2024-01-05 | Discharge: 2024-01-05 | Disposition: A | Discharge status: Home / Self Care | Source: Ambulatory Visit | Attending: Gastroenterology | Admitting: Gastroenterology

## 2024-01-05 ENCOUNTER — Other Ambulatory Visit: Payer: Self-pay

## 2024-01-09 ENCOUNTER — Ambulatory Visit (HOSPITAL_COMMUNITY): Admitting: Anesthesiology

## 2024-01-09 ENCOUNTER — Other Ambulatory Visit: Payer: Self-pay

## 2024-01-09 ENCOUNTER — Encounter (HOSPITAL_COMMUNITY): Payer: Self-pay | Admitting: Gastroenterology

## 2024-01-09 ENCOUNTER — Ambulatory Visit (HOSPITAL_COMMUNITY)
Admission: RE | Admit: 2024-01-09 | Discharge: 2024-01-09 | Disposition: A | Attending: Gastroenterology | Admitting: Gastroenterology

## 2024-01-09 ENCOUNTER — Encounter (HOSPITAL_COMMUNITY): Admission: RE | Disposition: A | Payer: Self-pay | Source: Home / Self Care | Attending: Gastroenterology

## 2024-01-09 DIAGNOSIS — I10 Essential (primary) hypertension: Secondary | ICD-10-CM | POA: Insufficient documentation

## 2024-01-09 DIAGNOSIS — K219 Gastro-esophageal reflux disease without esophagitis: Secondary | ICD-10-CM | POA: Diagnosis not present

## 2024-01-09 DIAGNOSIS — K2950 Unspecified chronic gastritis without bleeding: Secondary | ICD-10-CM | POA: Insufficient documentation

## 2024-01-09 DIAGNOSIS — Z950 Presence of cardiac pacemaker: Secondary | ICD-10-CM | POA: Insufficient documentation

## 2024-01-09 DIAGNOSIS — I252 Old myocardial infarction: Secondary | ICD-10-CM | POA: Diagnosis not present

## 2024-01-09 DIAGNOSIS — K2970 Gastritis, unspecified, without bleeding: Secondary | ICD-10-CM | POA: Diagnosis not present

## 2024-01-09 DIAGNOSIS — E7800 Pure hypercholesterolemia, unspecified: Secondary | ICD-10-CM | POA: Diagnosis not present

## 2024-01-09 DIAGNOSIS — Z87891 Personal history of nicotine dependence: Secondary | ICD-10-CM | POA: Insufficient documentation

## 2024-01-09 HISTORY — PX: ESOPHAGOGASTRODUODENOSCOPY: SHX5428

## 2024-01-09 SURGERY — EGD (ESOPHAGOGASTRODUODENOSCOPY)
Anesthesia: Monitor Anesthesia Care

## 2024-01-09 MED ORDER — LACTATED RINGERS IV SOLN: INTRAVENOUS | Status: DC

## 2024-01-09 MED ORDER — PROPOFOL 10 MG/ML IV BOLUS
INTRAVENOUS | Status: DC | PRN
  Administered 2024-01-09: 11:00:00 50 mg via INTRAVENOUS
  Administered 2024-01-09: 11:00:00 100 mg via INTRAVENOUS

## 2024-01-09 MED ORDER — LIDOCAINE 2% (20 MG/ML) 5 ML SYRINGE
INTRAMUSCULAR | Status: DC | PRN
  Administered 2024-01-09: 11:00:00 50 mg via INTRAVENOUS

## 2024-01-09 NOTE — Discharge Instructions (Addendum)
You are being discharged to home.  ?Resume your previous diet.  ?We are waiting for your pathology results.  ?Continue your present medications.  ?

## 2024-01-09 NOTE — Op Note (Signed)
 [facility] Patient Name: Andrew Mcgrath Procedure Date: 01/09/2024 10:38 AM MRN: [id number] Date of Birth: 1940-06-23 Attending MD: Toribio Fortune , , [id number] CSN: [id number] Age: 83 Admit Type: Outpatient Procedure:                Upper GI endoscopy Indications:              Follow-up of gastro-esophageal reflux disease Providers:                Toribio Fortune, Jon LABOR. Gerome RN, RN, Chad                            Wilson, Technician Referring MD:             Toribio Fortune Medicines:                Monitored Anesthesia Care Complications:            No immediate complications. Estimated Blood Loss:     Estimated blood loss: none. Procedure:                Pre-Anesthesia Assessment:                           - Prior to the procedure, a History and Physical                            was performed, and patient medications, allergies                            and sensitivities were reviewed. The patient's                            tolerance of previous anesthesia was reviewed.                           - The risks and benefits of the procedure and the                            sedation options and risks were discussed with the                            patient. All questions were answered and informed                            consent was obtained.                           - ASA Grade Assessment: II - A patient with mild                            systemic disease.                           After obtaining informed consent, the endoscope was                            passed under direct vision.  Throughout the                            procedure, the patient's blood pressure, pulse, and                            oxygen saturations were monitored continuously. The                            [id number]-[id number] ([id number]) Upper was introduced through                            the mouth, and advanced to the second part of                            duodenum. The upper GI  endoscopy was accomplished                            without difficulty. The patient tolerated the                            procedure well. Scope In: 11:00:07 AM Scope Out: 11:02:58 AM Total Procedure Duration: 0 hours 2 minutes 51 seconds  Findings:      The examined esophagus was normal.      The gastroesophageal flap valve was visualized endoscopically and       classified as Hill Grade II (fold present, opens with respiration).      Localized mild inflammation characterized by erythema was found in the       gastric body. Biopsies were taken with a cold forceps for Helicobacter       pylori testing.      The examined duodenum was normal. Impression:               - Normal esophagus.                           - Gastritis. Biopsied.                           - Normal examined duodenum. Moderate Sedation:      Per Anesthesia Care Recommendation:           - Discharge patient to home (ambulatory).                           - Resume previous diet.                           - Await pathology results.                           - Continue present medications. Procedure Code(s):        --- Professional ---                           [phone], Esophagogastroduodenoscopy, flexible,  transoral; with biopsy, single or multiple Diagnosis Code(s):        --- Professional ---                           K29.70, Gastritis, unspecified, without bleeding                           K21.9, Gastro-esophageal reflux disease without                            esophagitis CPT copyright 2022 American Medical Association. All rights reserved. The codes documented in this report are preliminary and upon coder review may  be revised to meet current compliance requirements. Toribio Fortune, MD Toribio Fortune,  01/09/2024 11:07:31 AM This report has been signed electronically. Number of Addenda: 0

## 2024-01-09 NOTE — Anesthesia Preprocedure Evaluation (Signed)
 Anesthesia Evaluation  Patient identified by MRN, date of birth, ID band Patient awake    Reviewed: Allergy & Precautions, H&P , NPO status , Patient's Chart, lab work & pertinent test results, reviewed documented beta blocker date and time   Airway Mallampati: II  TM Distance: >3 FB Neck ROM: full    Dental no notable dental hx.    Pulmonary neg pulmonary ROS, former smoker   Pulmonary exam normal breath sounds clear to auscultation       Cardiovascular Exercise Tolerance: Good hypertension, + Past MI  + dysrhythmias + pacemaker  Rhythm:regular Rate:Normal     Neuro/Psych  Headaches  negative psych ROS   GI/Hepatic Neg liver ROS,GERD  ,,  Endo/Other  negative endocrine ROS    Renal/GU negative Renal ROS  negative genitourinary   Musculoskeletal   Abdominal   Peds  Hematology negative hematology ROS (+)   Anesthesia Other Findings   Reproductive/Obstetrics negative OB ROS                              Anesthesia Physical Anesthesia Plan  ASA: 3  Anesthesia Plan: MAC   Post-op Pain Management:    Induction:   PONV Risk Score and Plan: Propofol  infusion  Airway Management Planned:   Additional Equipment:   Intra-op Plan:   Post-operative Plan:   Informed Consent: I have reviewed the patients History and Physical, chart, labs and discussed the procedure including the risks, benefits and alternatives for the proposed anesthesia with the patient or authorized representative who has indicated his/her understanding and acceptance.     Dental Advisory Given  Plan Discussed with: CRNA  Anesthesia Plan Comments:         Anesthesia Quick Evaluation

## 2024-01-09 NOTE — Interval H&P Note (Signed)
 History and Physical Interval Note:  01/09/2024 9:38 AM  Andrew Mcgrath  has presented today for surgery, with the diagnosis of Gastroesophageal reflux disease.  The various methods of treatment have been discussed with the patient and family. After consideration of risks, benefits and other options for treatment, the patient has consented to  Procedures with comments: EGD (ESOPHAGOGASTRODUODENOSCOPY) (N/A) - 11:00am, ASA any room as a surgical intervention.  The patient's history has been reviewed, patient examined, no change in status, stable for surgery.  I have reviewed the patient's chart and labs.  Questions were answered to the patient's satisfaction.     [name]

## 2024-01-09 NOTE — Transfer of Care (Signed)
 Immediate Anesthesia Transfer of Care Note  Patient: Andrew Mcgrath  Procedure(s) Performed: EGD (ESOPHAGOGASTRODUODENOSCOPY)  Patient Location: Short Stay  Anesthesia Type:General  Level of Consciousness: awake  Airway & Oxygen Therapy: Patient Spontanous Breathing  Post-op Assessment: Report given to RN  Post vital signs: Reviewed and stable  Last Vitals:  Vitals Value Taken Time  BP    Temp    Pulse    Resp    SpO2      Last Pain:  Vitals:   01/09/24 1058  TempSrc:   PainSc: 0-No pain         Complications: No notable events documented.

## 2024-01-10 ENCOUNTER — Other Ambulatory Visit (INDEPENDENT_AMBULATORY_CARE_PROVIDER_SITE_OTHER): Payer: Self-pay | Admitting: Gastroenterology

## 2024-01-10 ENCOUNTER — Encounter (HOSPITAL_COMMUNITY): Payer: Self-pay | Admitting: Gastroenterology

## 2024-01-10 MED ORDER — FAMOTIDINE 20 MG PO TABS: 20.0000 mg | ORAL_TABLET | Freq: Every day | ORAL | 3 refills | Status: AC

## 2024-01-11 ENCOUNTER — Ambulatory Visit (INDEPENDENT_AMBULATORY_CARE_PROVIDER_SITE_OTHER): Payer: Self-pay | Admitting: Gastroenterology

## 2024-01-11 LAB — SURGICAL PATHOLOGY

## 2024-01-13 NOTE — Anesthesia Postprocedure Evaluation (Signed)
"   Anesthesia Post Note  Patient: Andrew Mcgrath  Procedure(s) Performed: EGD (ESOPHAGOGASTRODUODENOSCOPY)  Patient location during evaluation: Phase II Anesthesia Type: MAC Level of consciousness: awake Pain management: pain level controlled Vital Signs Assessment: post-procedure vital signs reviewed and stable Respiratory status: spontaneous breathing and respiratory function stable Cardiovascular status: blood pressure returned to baseline and stable Postop Assessment: no headache and no apparent nausea or vomiting Anesthetic complications: no Comments: Late entry   No notable events documented.   Last Vitals:  Vitals:   01/09/24 0945 01/09/24 1116  BP: (!) 152/90 106/65  Pulse: 60 60  Resp: 18 16  Temp: 36.7 C 36.5 C  SpO2: 98% 97%    Last Pain:  Vitals:   01/09/24 1116  TempSrc: Axillary  PainSc: 0-No pain                 Yvonna PARAS Jezlyn Westerfield      "

## 2024-01-17 NOTE — Progress Notes (Signed)
 Patient result letter mailed procedure note and pathology result faxed to PCP

## 2024-01-22 ENCOUNTER — Ambulatory Visit
Admission: EM | Admit: 2024-01-22 | Discharge: 2024-01-22 | Disposition: A | Discharge status: Home / Self Care | Attending: Family Medicine | Admitting: Family Medicine

## 2024-01-22 DIAGNOSIS — R040 Epistaxis: Secondary | ICD-10-CM | POA: Diagnosis not present

## 2024-01-22 MED ORDER — OXYMETAZOLINE HCL 0.05 % NA SOLN: NASAL | 0 refills | Status: AC

## 2024-01-22 NOTE — Discharge Instructions (Addendum)
 I have prescribed Afrin for use only if another active nosebleed.  Apply 2 sprays to the side that is bleeding and then either apply external pressure or dip a small amount of gauze into Aquaphor or Vaseline and apply up the nostril to occlude.  For severe or unrelenting nosebleeds or if he becomes symptomatic to include dizziness, headaches, chest pain, shortness of breath or any other worsening symptoms go to the emergency department.  As we discussed, you may also run a humidifier in the home to help with the dry air, use nasal saline drops, apply Vaseline or Aquaphor to the nostrils to help protect the sensitive tissue.  Try to avoid blowing your nose hard or excessive sneezing when able

## 2024-01-22 NOTE — ED Provider Notes (Signed)
 " [facility]    CSN: [id number] Arrival date & time: 01/22/24  9195      History   Chief Complaint No chief complaint on file.   HPI Andrew Mcgrath is a 83 y.o. male.   Patient presenting today with 1 day history of episodic nosebleeds to the right nostril.  States they started spontaneously upon waking yesterday morning and have happened off-and-on throughout the day always fairly well-controlled with pressure and ice packs.  Denies headache, dizziness, chest pain, shortness of breath, uncontrolled bouts of bleeding.  Not on any anticoagulation.  States had something similar about 3 years ago on the other side and had to have cautery.    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   Syncope and collapse 07/21/2022   Memory difficulties 01/29/2021   Left bundle branch block 12/04/2020   Pure hypercholesterolemia 12/04/2020   Chronic headaches 12/04/2020   Chest pain of uncertain etiology 12/04/2020   Allergic reaction to bee sting 08/10/2017    Past Surgical History:  Procedure Laterality Date   APPENDECTOMY     CARDIAC CATHETERIZATION     COLONOSCOPY     COLONOSCOPY N/A 08/23/2013   Procedure: COLONOSCOPY;  Surgeon: Claudis RAYMOND Rivet, MD;  Location: [facility];  Service: Endoscopy;  Laterality: N/A;  1200   COLONOSCOPY N/A 11/06/2020   Procedure: COLONOSCOPY;  Surgeon: Rivet Claudis RAYMOND, MD;  Location: [facility];  Service: Endoscopy;  Laterality: N/A;  10:30   ESOPHAGOGASTRODUODENOSCOPY N/A 01/09/2024   Procedure: EGD (ESOPHAGOGASTRODUODENOSCOPY);  Surgeon: Eartha Flavors, Toribio, MD;  Location: [facility];  Service: Gastroenterology;  Laterality: N/A;  11:00am, ASA any room   PACEMAKER IMPLANT N/A 09/06/2022   Procedure: PACEMAKER IMPLANT - DUAL CHAMBER;  Surgeon: Waddell Danelle ORN, MD;  Location: [facility];  Service: Cardiovascular;  Laterality: N/A;   POLYPECTOMY  11/06/2020   Procedure: POLYPECTOMY;  Surgeon: Rivet Claudis RAYMOND, MD;  Location: [facility];  Service: Endoscopy;;   TONSILLECTOMY         Home Medications    Prior to Admission medications  Medication Sig Start Date End Date Taking? Authorizing Provider  oxymetazoline  (AFRIN NASAL SPRAY) 0.05 % nasal spray Apply 2 sprays to the bleeding nostril up to 3 times daily as needed for active nosebleed prior to applying pressure. 01/22/24  Yes Stuart Vernell Norris, PA-C  acetaminophen  (TYLENOL ) 500 MG tablet Take 500-1,000 mg by mouth every 6 (six) hours as needed for headache (Neuropathy).    [provider]  aspirin  EC 81 MG tablet Take 1 tablet (81 mg total) by mouth daily with breakfast. 07/22/22   Pearlean Manus, MD  atorvastatin  (LIPITOR) 40 MG tablet TAKE 1 TABLET BY MOUTH EVERY DAY 01/04/24   Alvan Dorn FALCON, MD  cetirizine (ZYRTEC) 10 MG tablet Take 10 mg by mouth daily as needed for allergies.    [provider]  Cholecalciferol (D3) 25 MCG (1000 UT) capsule Take 1,000 Units by mouth daily.    [provider]  diclofenac  Sodium (VOLTAREN  ARTHRITIS PAIN) 1 % GEL Apply 2  g topically daily as needed (pain).    [provider]  EPINEPHrine  0.3 mg/0.3 mL IJ SOAJ injection Inject 0.3 mg into the muscle as needed for anaphylaxis (Bee stings).    [provider]  famotidine  (PEPCID ) 20 MG tablet Take 1 tablet (20 mg total) by mouth at bedtime. 01/10/24   Castaneda Mayorga, Daniel, MD  fluticasone (FLONASE) 50 MCG/ACT nasal spray Place 1 spray into both nostrils daily as needed for allergies or rhinitis.    [provider]   losartan  (COZAAR ) 50 MG tablet TAKE 1 TABLET BY MOUTH EVERY DAY 01/04/24   Alvan Dorn FALCON, MD  metoprolol  succinate (TOPROL -XL) 25 MG 24 hr tablet Take 1 tablet (25 mg total) by mouth daily. Please call [phone] to schedule an appointment with Dr. Alvan for future refills. Thank you. 06/28/23   Alvan Dorn FALCON, MD  Multiple Vitamins-Minerals (MULTIVITAMIN WITH MINERALS) tablet Take 1 tablet by mouth daily.    [provider]  pantoprazole  (PROTONIX ) 40 MG tablet Take 40 mg by mouth daily.    [provider]  sildenafil (VIAGRA) 100 MG tablet SMARTSIG:1 Tablet(s) By Mouth Patient taking differently: as needed. 10/29/22   [provider]  testosterone  cypionate (DEPOTESTOSTERONE CYPIONATE) 200 MG/ML injection Inject 200 mg into the muscle See admin instructions. Every three weeks Patient taking differently: Inject 100 mg into the muscle every 14 (fourteen) days.    [provider]  vitamin B-12 (CYANOCOBALAMIN) 100 MCG tablet Take 100 mcg by mouth daily.    [provider]    Family History Family History  Problem Relation Age of Onset   Heart failure Father    Diabetes Daughter    Diabetes Son    Colon cancer Neg Hx     Social History Social History[1]   Allergies   Bee venom   Review of Systems Review of Systems Per HPI  Physical Exam Triage Vital Signs ED Triage Vitals  Encounter Vitals Group     BP 01/22/24 0816 127/73     Girls Systolic BP Percentile --      Girls Diastolic BP Percentile --      Boys Systolic BP Percentile --      Boys Diastolic BP Percentile --      Pulse Rate 01/22/24 0816 67     Resp 01/22/24 0816 20     Temp 01/22/24 0816 98.1 F (36.7 C)     Temp Source 01/22/24 0816 Oral     SpO2 01/22/24 0816 96 %     Weight --      Height --      Head Circumference --      Peak Flow --      Pain Score 01/22/24 0818 0     Pain Loc --      Pain Education --      Exclude from Growth Chart --    No  data found.  Updated Vital Signs BP 127/73 (BP Location: Right Arm)   Pulse 67   Temp 98.1 F (36.7 C) (Oral)   Resp 20   SpO2 96%   Visual Acuity Right Eye Distance:   Left Eye Distance:   Bilateral Distance:    Right Eye Near:   Left Eye Near:    Bilateral Near:     Physical Exam Vitals and nursing note reviewed.  Constitutional:      Appearance: Normal appearance.  HENT:     Head: Atraumatic.     Nose:  Comments: Trace dried blood present to the right nare, no appreciable polyps, abrasions or other abnormalities on internal exam of the right nare    Mouth/Throat:     Mouth: Mucous membranes are moist.  Eyes:     Extraocular Movements: Extraocular movements intact.     Conjunctiva/sclera: Conjunctivae normal.  Cardiovascular:     Rate and Rhythm: Normal rate.  Pulmonary:     Effort: Pulmonary effort is normal.  Musculoskeletal:        General: Normal range of motion.     Cervical back: Normal range of motion and neck supple.  Skin:    General: Skin is warm and dry.  Neurological:     Mental Status: He is oriented to person, place, and time.  Psychiatric:        Mood and Affect: Mood normal.        Thought Content: Thought content normal.        Judgment: Judgment normal.      UC Treatments / Results  Labs (all labs ordered are listed, but only abnormal results are displayed) Labs Reviewed - No data to display  EKG   Radiology No results found.  Procedures Procedures (including critical care time)  Medications Ordered in UC Medications - No data to display  Initial Impression / Assessment and Plan / UC Course  I have reviewed the triage vital signs and the nursing notes.  Pertinent labs & imaging results that were available during my care of the patient were reviewed by me and considered in my medical decision making (see chart for details).     Well-appearing on exam today, no active nosebleed, nosebleeds have been controlled with  pressure and ice packs at home.  Not on any anticoagulation.  Discussed supportive measures such as humidifiers, nasal saline drops, avoiding firm blowing of the nose or triggers for sneezing and Vaseline or Aquaphor to the nostrils as needed.  Treat any further nosebleeds with Afrin, pressure and go to the emergency department for severe unrelenting nosebleeds.  They state he has an ENT that they will follow-up with next week if episodic bleeds do not resolve as well.  Final Clinical Impressions(s) / UC Diagnoses   Final diagnoses:  Epistaxis     Discharge Instructions      I have prescribed Afrin for use only if another active nosebleed.  Apply 2 sprays to the side that is bleeding and then either apply external pressure or dip a small amount of gauze into Aquaphor or Vaseline and apply up the nostril to occlude.  For severe or unrelenting nosebleeds or if he becomes symptomatic to include dizziness, headaches, chest pain, shortness of breath or any other worsening symptoms go to the emergency department.  As we discussed, you may also run a humidifier in the home to help with the dry air, use nasal saline drops, apply Vaseline or Aquaphor to the nostrils to help protect the sensitive tissue.  Try to avoid blowing your nose hard or excessive sneezing when able    ED Prescriptions     Medication Sig Dispense Auth. Provider   oxymetazoline  (AFRIN NASAL SPRAY) 0.05 % nasal spray Apply 2 sprays to the bleeding nostril up to 3 times daily as needed for active nosebleed prior to applying pressure. 30 mL Stuart Vernell Norris, [place]      PDMP not reviewed this encounter.    [1]  Social History Tobacco Use   Smoking status: Former    Current packs/day: 1.50  Average packs/day: 1.5 packs/day for 20.0 years (30.0 ttl pk-yrs)    Types: Cigarettes   Smokeless tobacco: Never  Vaping Use   Vaping status: Never Used  Substance Use Topics   Alcohol use: Yes    Comment: wine   Drug use: No      Stuart Vernell Norris, PA-C 01/22/24 9070  "

## 2024-01-22 NOTE — ED Triage Notes (Signed)
 Pt reports no bleeds ongoing since yesterday morning when he woke up with blood in his mouth. Wife states the nose will form clots but once he blows his nose the clots are removed and it starts to bleed again.

## 2024-01-25 ENCOUNTER — Encounter: Payer: Self-pay | Admitting: Internal Medicine

## 2024-01-25 ENCOUNTER — Ambulatory Visit: Admitting: Internal Medicine

## 2024-01-25 VITALS — BP 122/65 | HR 60 | Ht 73.0 in | Wt 213.6 lb

## 2024-01-25 DIAGNOSIS — I447 Left bundle-branch block, unspecified: Secondary | ICD-10-CM

## 2024-01-25 DIAGNOSIS — I10 Essential (primary) hypertension: Secondary | ICD-10-CM

## 2024-01-25 LAB — CUP PACEART INCLINIC DEVICE CHECK
Date Time Interrogation Session: 20251231142608
Implantable Lead Connection Status: 753985
Implantable Lead Connection Status: 753985
Implantable Lead Implant Date: 20240812
Implantable Lead Implant Date: 20240812
Implantable Lead Location: 753859
Implantable Lead Location: 753860
Implantable Lead Model: 3830
Implantable Lead Model: 5076
Implantable Pulse Generator Implant Date: 20240812

## 2024-01-25 NOTE — Patient Instructions (Signed)
 Medication Instructions:  Your physician recommends that you continue on your current medications as directed. Please refer to the Current Medication list given to you today.  *If you need a refill on your cardiac medications before your next appointment, please call your pharmacy*  Lab Work: None ordered.  You may go to any [facility] Location for your lab work:  [facility] - [address] ([facility]) - [address] [phone] [address]  [place] - [address]   [place]  - [address]   [place] - [address] - [address] [place]  - [address] [place] [address] ([facility]   If you have labs (blood work) drawn today and your tests are completely normal, you will receive your results only by: [employer] (if you have MyChart)  If you have any lab test that is abnormal or we need to change your treatment, we will call you or send a MyChart message to review the results.  Testing/Procedures: None ordered.  Follow-Up: At [facility], you and your health needs are our priority.  As part of our continuing mission to provide you with exceptional heart care, we have created designated Provider Care Teams.  These Care Teams include your primary Cardiologist (physician) and Advanced Practice Providers (APPs -  Physician Assistants and Nurse Practitioners) who all work together to provide you with the care you need, when you need it.  Your next appointment:   1 year(s)  The format for your next appointment:   In Person  Provider:   Donnice Primus, MD in [place]

## 2024-01-25 NOTE — Progress Notes (Signed)
 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 He is a pleasant 83 yo man with LBBB, and a preserved LV function. He wore a cardiac monitor which demonstrated CHB and underwent PPM insertion.  He has done well since PPM insertion. No chest pain or sob with exertion.  Allergies[1]   Current Outpatient Medications  Medication Sig Dispense Refill   acetaminophen  (TYLENOL ) 500 MG tablet Take 500-1,000 mg by mouth every 6 (six) hours as needed for headache (Neuropathy).     aspirin  EC 81 MG tablet Take 1 tablet (81 mg total) by mouth daily with breakfast. 30 tablet 11   atorvastatin  (LIPITOR) 40 MG tablet TAKE 1 TABLET BY MOUTH EVERY DAY 15 tablet 0   cetirizine (ZYRTEC) 10 MG tablet Take 10 mg by mouth daily as needed for allergies.     Cholecalciferol (D3) 25 MCG (1000 UT) capsule Take 1,000 Units by mouth daily.     diclofenac  Sodium (VOLTAREN  ARTHRITIS PAIN) 1 % GEL Apply 2 g topically daily as needed (pain).     EPINEPHrine  0.3 mg/0.3 mL IJ SOAJ injection Inject 0.3 mg into the muscle as needed for anaphylaxis (Bee stings).     famotidine  (PEPCID ) 20 MG tablet Take 1 tablet (20 mg total) by mouth at bedtime. 90 tablet 3   fluticasone (FLONASE) 50 MCG/ACT nasal spray Place 1 spray into both nostrils daily as needed for allergies or rhinitis.     losartan  (COZAAR ) 50 MG tablet TAKE 1 TABLET BY MOUTH EVERY DAY 15 tablet 0   metoprolol  succinate (TOPROL -XL) 25 MG 24 hr tablet Take 1 tablet (25 mg total) by mouth daily. Please call [phone] to schedule an appointment with Dr. Alvan for future refills. Thank you. 90 tablet 0   Multiple Vitamins-Minerals (MULTIVITAMIN WITH MINERALS) tablet Take 1 tablet by mouth daily.     oxymetazoline  (AFRIN NASAL SPRAY) 0.05 % nasal spray Apply 2 sprays to the bleeding nostril up to 3 times daily as needed for active nosebleed prior to applying pressure. 30 mL 0   pantoprazole  (PROTONIX ) 40 MG tablet Take 40 mg by mouth daily.      sildenafil (VIAGRA) 100 MG tablet SMARTSIG:1 Tablet(s) By Mouth (Patient taking differently: as needed.)     testosterone  cypionate (DEPOTESTOSTERONE CYPIONATE) 200 MG/ML injection Inject 200 mg into the muscle See admin instructions. Every three weeks (Patient taking differently: Inject 100 mg into the muscle every 14 (fourteen) days.)     valACYclovir (VALTREX) 500 MG tablet Take 500 mg by mouth daily.     vitamin B-12 (CYANOCOBALAMIN) 100 MCG tablet Take 100 mcg by mouth daily.     No current facility-administered medications for this visit.     Past Medical History:  Diagnosis Date   Bradycardia    Familial restless leg syndrome    GERD (gastroesophageal reflux disease)    Hyperlipidemia    Myocardial infarction (HCC)    Presence of permanent cardiac pacemaker     ROS:   All systems reviewed and negative except as noted in the HPI.   Past Surgical History:  Procedure Laterality Date   APPENDECTOMY     CARDIAC CATHETERIZATION     COLONOSCOPY     COLONOSCOPY N/A 08/23/2013   Procedure: COLONOSCOPY;  Surgeon: Claudis RAYMOND Rivet, MD;  Location: [facility];  Service: Endoscopy;  Laterality: N/A;  1200   COLONOSCOPY N/A 11/06/2020   Procedure: COLONOSCOPY;  Surgeon: Rivet Claudis RAYMOND, MD;  Location: [facility];  Service: Endoscopy;  Laterality: N/A;  10:30   ESOPHAGOGASTRODUODENOSCOPY N/A 01/09/2024   Procedure: EGD (ESOPHAGOGASTRODUODENOSCOPY);  Surgeon: Eartha Flavors, Toribio, MD;  Location: [facility];  Service: Gastroenterology;  Laterality: N/A;  11:00am, ASA any room   PACEMAKER IMPLANT N/A 09/06/2022   Procedure: PACEMAKER IMPLANT - DUAL CHAMBER;  Surgeon: Waddell Andrew ORN, MD;  Location: [facility];  Service: Cardiovascular;  Laterality: N/A;   POLYPECTOMY  11/06/2020   Procedure: POLYPECTOMY;  Surgeon: Golda Claudis PENNER, MD;  Location: [facility];  Service: Endoscopy;;   TONSILLECTOMY       Family History  Problem Relation Age of Onset   Heart  failure Father    Diabetes Daughter    Diabetes Son    Colon cancer Neg Hx      Social History   Socioeconomic History   Marital status: Married    Spouse name: Not on file   Number of children: 3   Years of education: 12   Highest education level: Not on file  Occupational History   Not on file  Tobacco Use   Smoking status: Former    Current packs/day: 1.50    Average packs/day: 1.5 packs/day for 20.0 years (30.0 ttl pk-yrs)    Types: Cigarettes   Smokeless tobacco: Never  Vaping Use   Vaping status: Never Used  Substance and Sexual Activity   Alcohol use: Yes    Comment: wine   Drug use: No   Sexual activity: Not on file  Other Topics Concern   Not on file  Social History Narrative   Right handed   Drinks caffeine    2 story home   Social Drivers of Health   Tobacco Use: Medium Risk (01/25/2024)   Patient History    Smoking Tobacco Use: Former    Smokeless Tobacco Use: Never    Passive Exposure: Not on Actuary Strain: Not on file  Food Insecurity: No Food Insecurity (07/21/2022)   Hunger Vital Sign    Worried About Running Out of Food in the Last Year: Never true    Ran Out of Food in the Last Year: Never true  Transportation Needs: No Transportation Needs (07/21/2022)   PRAPARE - Administrator, Civil Service (Medical): No    Lack of Transportation (Non-Medical): No  Physical Activity: Not on file  Stress: Not on file  Social Connections: Not on file  Intimate Partner Violence: Not At Risk (07/21/2022)   Humiliation, Afraid, Rape, and Kick questionnaire    Fear of Current or Ex-Partner: No    Emotionally Abused: No    Physically Abused: No    Sexually Abused: No  Depression (PHQ2-9): Not on file  Alcohol Screen: Not on file  Housing: Low Risk (07/21/2022)   Housing    Last Housing Risk Score: 0  Utilities: Not At Risk (07/21/2022)   A[facility] Utilities    Threatened with loss of utilities: No  Health Literacy: Not on file      BP 122/65 (BP Location: Right Arm, Patient Position: Sitting, Cuff Size: Normal)   Pulse 66   Ht 6' 1 (1.854 m)   Wt 213 lb 9.6 oz (96.9 kg)   SpO2 94%   BMI 28.18 kg/m   Physical Exam:  Well appearing NAD HEENT: Unremarkable Neck:  No JVD, no thyromegally Lymphatics:  No adenopathy Back:  No CVA tenderness Lungs:  Clear HEART:  Regular rate rhythm, no murmurs, no rubs, no clicks  Abd:  soft, positive bowel sounds, no organomegally, no rebound, no guarding Ext:  2 plus pulses, no edema, no cyanosis, no clubbing Skin:  No rashes no nodules Neuro:  CN II through XII intact, motor grossly intact  EKG - nsr with AV pacing  DEVICE  Normal device function.  See PaceArt for details.   Assess/Plan:  Stokes Adams attacks - He is s/p insertion of a DDD PM 8/24.  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 He will continue his current meds.His meds are being adjusted by Dr. Alvan. He is still walking 4 miles a day without limit.   Andrew Rick Warnick,MD     [1]  Allergies Allergen Reactions   Bee Venom Swelling   "

## 2024-02-15 ENCOUNTER — Telehealth: Payer: Self-pay | Admitting: Student in an Organized Health Care Education/Training Program

## 2024-02-15 NOTE — Telephone Encounter (Signed)
 Wife was calling in to see if the metal detectors at the hospital is the same at the airport. She stated they showed the security guards the card but they still wouldn't let the patient in. Patient didn't feel comfortable with going through the detectors because he was sure if he could or not. Please advise

## 2024-02-16 NOTE — Telephone Encounter (Signed)
 Spoke with patient's wife.   She said 2 times in the recent past at [facility] they refused entrance to the hospital for patient due to the fact they were insisting he go through the metal detectors.  He presented his card for his pacemaker and let them know that he was supposed to be wanded rather than going through the detector.   As a result, patient left hospital without receiving access to care.   I did confirm with [employer] and they stand by the directive that the patient should not go through the detector and instead should be wanded.  Airports/courthouses etc...are aware and this in general has not been an issue.   Will escalate this and recommend discussion with our security services regarding their approach and suggest education to their officers.

## 2024-02-29 ENCOUNTER — Other Ambulatory Visit: Payer: Self-pay | Admitting: Cardiology

## 2024-03-02 NOTE — Telephone Encounter (Signed)
 Noted. Reached out to Security leader to review standard practices for patients with implanted cardiac devices.
